# Patient Record
Sex: Female | Born: 1985 | Race: Black or African American | Hispanic: No | Marital: Single | Smoking: Current some day smoker
Health system: Southern US, Community
[De-identification: ages and names within clinical notes are randomized; demographics above are authoritative.]

## PROBLEM LIST (undated history)

## (undated) DIAGNOSIS — E7800 Pure hypercholesterolemia, unspecified: Secondary | ICD-10-CM

## (undated) DIAGNOSIS — L0591 Pilonidal cyst without abscess: Secondary | ICD-10-CM

## (undated) DIAGNOSIS — F419 Anxiety disorder, unspecified: Secondary | ICD-10-CM

## (undated) DIAGNOSIS — I499 Cardiac arrhythmia, unspecified: Secondary | ICD-10-CM

## (undated) DIAGNOSIS — N879 Dysplasia of cervix uteri, unspecified: Secondary | ICD-10-CM

## (undated) DIAGNOSIS — L732 Hidradenitis suppurativa: Secondary | ICD-10-CM

## (undated) DIAGNOSIS — E119 Type 2 diabetes mellitus without complications: Secondary | ICD-10-CM

## (undated) DIAGNOSIS — K760 Fatty (change of) liver, not elsewhere classified: Secondary | ICD-10-CM

## (undated) DIAGNOSIS — E785 Hyperlipidemia, unspecified: Secondary | ICD-10-CM

## (undated) HISTORY — DX: Pure hypercholesterolemia, unspecified: E78.00

## (undated) HISTORY — PX: WISDOM TOOTH EXTRACTION: SHX21

## (undated) HISTORY — DX: Hidradenitis suppurativa: L73.2

## (undated) HISTORY — DX: Type 2 diabetes mellitus without complications: E11.9

## (undated) HISTORY — DX: Fatty (change of) liver, not elsewhere classified: K76.0

## (undated) HISTORY — DX: Pilonidal cyst without abscess: L05.91

## (undated) HISTORY — DX: Dysplasia of cervix uteri, unspecified: N87.9

## (undated) HISTORY — PX: OTHER SURGICAL HISTORY: SHX169

## (undated) HISTORY — PX: PERINEAL HIDRADENITIS EXCISION: SUR524

## (undated) HISTORY — DX: Hyperlipidemia, unspecified: E78.5

---

## 2005-07-16 ENCOUNTER — Emergency Department (HOSPITAL_COMMUNITY): Admission: EM | Admit: 2005-07-16 | Discharge: 2005-07-16 | Payer: Self-pay | Admitting: Family Medicine

## 2006-08-22 ENCOUNTER — Emergency Department (HOSPITAL_COMMUNITY): Admission: EM | Admit: 2006-08-22 | Discharge: 2006-08-23 | Payer: Self-pay | Admitting: Emergency Medicine

## 2007-01-21 ENCOUNTER — Emergency Department (HOSPITAL_COMMUNITY): Admission: EM | Admit: 2007-01-21 | Discharge: 2007-01-21 | Payer: Self-pay | Admitting: Emergency Medicine

## 2007-02-18 ENCOUNTER — Emergency Department (HOSPITAL_COMMUNITY): Admission: EM | Admit: 2007-02-18 | Discharge: 2007-02-18 | Payer: Self-pay | Admitting: Emergency Medicine

## 2007-04-29 ENCOUNTER — Emergency Department: Payer: Self-pay | Admitting: Emergency Medicine

## 2007-05-31 ENCOUNTER — Emergency Department (HOSPITAL_COMMUNITY): Admission: EM | Admit: 2007-05-31 | Discharge: 2007-05-31 | Payer: Self-pay | Admitting: Emergency Medicine

## 2007-06-02 ENCOUNTER — Emergency Department (HOSPITAL_COMMUNITY): Admission: EM | Admit: 2007-06-02 | Discharge: 2007-06-02 | Payer: Self-pay | Admitting: Emergency Medicine

## 2008-02-02 ENCOUNTER — Encounter: Payer: Self-pay | Admitting: Maternal and Fetal Medicine

## 2008-03-01 ENCOUNTER — Encounter: Payer: Self-pay | Admitting: Maternal and Fetal Medicine

## 2008-05-06 ENCOUNTER — Encounter: Payer: Self-pay | Admitting: Maternal & Fetal Medicine

## 2008-05-07 ENCOUNTER — Observation Stay: Payer: Self-pay | Admitting: Obstetrics and Gynecology

## 2008-05-10 ENCOUNTER — Observation Stay: Payer: Self-pay | Admitting: Obstetrics and Gynecology

## 2008-05-11 ENCOUNTER — Observation Stay: Payer: Self-pay | Admitting: Obstetrics and Gynecology

## 2008-05-13 ENCOUNTER — Encounter: Payer: Self-pay | Admitting: Maternal and Fetal Medicine

## 2008-05-17 ENCOUNTER — Encounter: Payer: Self-pay | Admitting: Obstetrics & Gynecology

## 2008-05-18 ENCOUNTER — Observation Stay: Payer: Self-pay | Admitting: Obstetrics and Gynecology

## 2008-05-20 ENCOUNTER — Encounter: Payer: Self-pay | Admitting: Obstetrics and Gynecology

## 2008-05-20 ENCOUNTER — Inpatient Hospital Stay: Payer: Self-pay | Admitting: Obstetrics and Gynecology

## 2008-05-27 ENCOUNTER — Encounter: Payer: Self-pay | Admitting: Maternal & Fetal Medicine

## 2009-05-16 ENCOUNTER — Emergency Department: Payer: Self-pay | Admitting: Emergency Medicine

## 2009-05-18 ENCOUNTER — Emergency Department: Payer: Self-pay | Admitting: Emergency Medicine

## 2010-01-15 DIAGNOSIS — L0591 Pilonidal cyst without abscess: Secondary | ICD-10-CM

## 2010-01-15 HISTORY — DX: Pilonidal cyst without abscess: L05.91

## 2010-01-17 ENCOUNTER — Emergency Department: Payer: Self-pay | Admitting: Internal Medicine

## 2010-03-09 ENCOUNTER — Ambulatory Visit: Payer: Self-pay | Admitting: Surgery

## 2010-03-13 ENCOUNTER — Ambulatory Visit: Payer: Self-pay | Admitting: Surgery

## 2010-08-29 ENCOUNTER — Emergency Department: Payer: Self-pay | Admitting: Unknown Physician Specialty

## 2010-10-06 LAB — POCT URINALYSIS DIP (DEVICE)
Nitrite: NEGATIVE
Protein, ur: 30 — AB
pH: 6.5

## 2010-10-06 LAB — POCT PREGNANCY, URINE
Operator id: 235561
Preg Test, Ur: NEGATIVE

## 2012-01-16 DIAGNOSIS — E119 Type 2 diabetes mellitus without complications: Secondary | ICD-10-CM

## 2012-01-16 DIAGNOSIS — N879 Dysplasia of cervix uteri, unspecified: Secondary | ICD-10-CM

## 2012-01-16 DIAGNOSIS — L732 Hidradenitis suppurativa: Secondary | ICD-10-CM

## 2012-01-16 HISTORY — DX: Hidradenitis suppurativa: L73.2

## 2012-01-16 HISTORY — PX: COLPOSCOPY: SHX161

## 2012-01-16 HISTORY — DX: Dysplasia of cervix uteri, unspecified: N87.9

## 2012-01-16 HISTORY — DX: Type 2 diabetes mellitus without complications: E11.9

## 2012-06-14 ENCOUNTER — Ambulatory Visit: Payer: Self-pay | Admitting: Surgery

## 2012-06-16 LAB — WOUND CULTURE

## 2012-06-30 DIAGNOSIS — E7800 Pure hypercholesterolemia, unspecified: Secondary | ICD-10-CM

## 2012-06-30 DIAGNOSIS — E785 Hyperlipidemia, unspecified: Secondary | ICD-10-CM | POA: Insufficient documentation

## 2012-06-30 HISTORY — DX: Hyperlipidemia, unspecified: E78.5

## 2012-06-30 HISTORY — DX: Pure hypercholesterolemia, unspecified: E78.00

## 2012-07-25 ENCOUNTER — Ambulatory Visit: Payer: Self-pay | Admitting: Nurse Practitioner

## 2012-08-15 ENCOUNTER — Ambulatory Visit: Payer: Self-pay | Admitting: Nurse Practitioner

## 2012-09-15 ENCOUNTER — Ambulatory Visit: Payer: Self-pay | Admitting: Nurse Practitioner

## 2012-10-08 ENCOUNTER — Encounter: Payer: Self-pay | Admitting: General Surgery

## 2012-10-08 ENCOUNTER — Ambulatory Visit (INDEPENDENT_AMBULATORY_CARE_PROVIDER_SITE_OTHER): Payer: 59 | Admitting: General Surgery

## 2012-10-08 VITALS — BP 120/80 | HR 74 | Resp 12 | Ht 71.0 in | Wt 212.0 lb

## 2012-10-08 DIAGNOSIS — L732 Hidradenitis suppurativa: Secondary | ICD-10-CM | POA: Insufficient documentation

## 2012-10-08 NOTE — Progress Notes (Signed)
Patient ID: Carla Payne, female   DOB: 01-27-1985, 27 y.o.   MRN: [id number]  Chief Complaint  Patient presents with  . Other    right axillary abscess    HPI Carla Payne is a 27 y.o. female here today for an evaluation of an right axillary abscess. Patient states she notices this about 6 month . She states it has been draining and wont close up. Patient had at left axillary mass 10 cm removed in May 2014. This was completed by Dr. Michela Pitcher. It was near this time that she was diagnosed with diabetes. She initially was checking her sugars daily, but has become somewhat lax about this. She is accompanied today by her companion, Will.  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 HPI  Past Medical History  Diagnosis Date  . Hyperlipidemia   . Pilonidal cyst 2012  . Diabetes mellitus without complication 2014    Past Surgical History  Procedure Laterality Date  . Axillary mass Left 2014 ,may    No family history on file.  Social History History  Substance Use Topics  . Smoking status: Current Every Day Smoker -- 0.25 packs/day for 10 years  . Smokeless tobacco: Never Used  . Alcohol Use: Yes    No Known Allergies  Current Outpatient Prescriptions  Medication Sig Dispense Refill  . acyclovir (ZOVIRAX) 400 MG tablet Take 1 tablet by mouth daily.      Marland Kitchen glipiZIDE (GLUCOTROL XL) 5 MG 24 hr tablet Take 1 tablet by mouth daily.      . metFORMIN (GLUCOPHAGE) 1000 MG tablet Take 1 tablet by mouth 2 (two) times daily.      Marland Kitchen sulfamethoxazole-trimethoprim (BACTRIM DS) 800-160 MG per tablet Take 1 tablet by mouth daily.      . valACYclovir (VALTREX) 1000 MG tablet Take 1 tablet by mouth daily.       No current facility-administered medications for this visit.    Review of Systems Review of Systems  Constitutional: Negative.   Respiratory: Negative.   Cardiovascular: Negative.     Blood pressure 120/80, pulse 74, resp. rate 12, height 5\' 11"   (1.803 m), weight 212 lb (96.163 kg).  Physical Exam Physical Exam  Constitutional: She is oriented to person, place, and time. She appears well-developed and well-nourished.  Cardiovascular: Normal rate, regular rhythm and normal heart sounds.   Pulmonary/Chest: Breath sounds normal.  Lymphadenopathy:    She has no cervical adenopathy.  Right axillary site has 2 spots that are draining and diffuse thickening covering a 6 x 12 cm area. Left axillary exam shows a less than 5 mm area of granulation tissue, likely at the site of a previously placed drain. No evidence of a better nidus in the axilla..    Neurological: She is alert and oriented to person, place, and time.  Skin: Skin is warm and dry.    Data Reviewed Operative report dated Jun 14, 2012 described drainage of left axillary abscess. Culture showed heavy growth of staph aureus sensitive to all antibiotics.  Assessment    Right axillary hydradenitis     Plan    Excision of this area would be appropriate considering her failure to improve with multiple courses of oral antibiotics.  The importance of daily blood sugar checks as previously instructed by her primary care physician was reviewed. The catastrophic effects of smoking with obesity and diabetes was discussed.     Patient to call the office when ready  to arrange surgery date.  [name] 10/08/2012, 9:16 PM

## 2012-10-08 NOTE — Patient Instructions (Addendum)
Hidradenitis Suppurativa, Sweat Gland Abscess Hidradenitis suppurativa is a long lasting (chronic), uncommon disease of the sweat glands. With this, boil-like lumps and scarring develop in the groin, some times under the arms (axillae), and under the breasts. It may also uncommonly occur behind the ears, in the crease of the buttocks, and around the genitals.  CAUSES  The cause is from a blocking of the sweat glands. They then become infected. It may cause drainage and odor. It is not contagious. So it cannot be given to someone else. It most often shows up in puberty (about 74 to 28 years of age). But it may happen much later. It is similar to acne which is a disease of the sweat glands. This condition is slightly more common in African-Americans and women. SYMPTOMS   Hidradenitis usually starts as one or more red, tender, swellings in the groin or under the arms (axilla).  Over a period of hours to days the lesions get larger. They often open to the skin surface, draining clear to yellow-colored fluid.  The infected area heals with scarring. DIAGNOSIS  Your caregiver makes this diagnosis by looking at you. Sometimes cultures (growing germs on plates in the lab) may be taken. This is to see what germ (bacterium) is causing the infection.  TREATMENT   Topical germ killing medicine applied to the skin (antibiotics) are the treatment of choice. Antibiotics taken by mouth (systemic) are sometimes needed when the condition is getting worse or is severe.  Avoid tight-fitting clothing which traps moisture in.  Dirt does not cause hidradenitis and it is not caused by poor hygiene.  Involved areas should be cleaned daily using an antibacterial soap. Some patients find that the liquid form of Lever 2000, applied to the involved areas as a lotion after bathing, can help reduce the odor related to this condition.  Sometimes surgery is needed to drain infected areas or remove scarred tissue. Removal of  large amounts of tissue is used only in severe cases.  Birth control pills may be helpful.  Oral retinoids (vitamin A derivatives) for 6 to 12 months which are effective for acne may also help this condition.  Weight loss will improve but not cure hidradenitis. It is made worse by being overweight. But the condition is not caused by being overweight.  This condition is more common in people who have had acne.  It may become worse under stress. There is no medical cure for hidradenitis. It can be controlled, but not cured. The condition usually continues for years with periods of getting worse and getting better (remission). Document Released: 08/16/2003 Document Revised: 03/26/2011 Document Reviewed: 09/01/2007 [facility] Patient Information 2014 [place], [place].  Patient to call when ready to arrange surgery date.

## 2012-11-13 ENCOUNTER — Telehealth: Payer: Self-pay | Admitting: *Deleted

## 2012-11-13 NOTE — Telephone Encounter (Signed)
Patient called the office wanting to arrange surgery date. This patient's surgery has been scheduled for 01-05-13 at [facility]. A pre-op visit will be required prior to procedure and this has been arranged accordingly. She will contact the office if she has further questions or concerns.

## 2012-12-28 ENCOUNTER — Other Ambulatory Visit: Payer: Self-pay | Admitting: General Surgery

## 2012-12-28 DIAGNOSIS — L732 Hidradenitis suppurativa: Secondary | ICD-10-CM

## 2012-12-29 ENCOUNTER — Encounter: Payer: Self-pay | Admitting: General Surgery

## 2012-12-29 ENCOUNTER — Ambulatory Visit (INDEPENDENT_AMBULATORY_CARE_PROVIDER_SITE_OTHER): Payer: 59 | Admitting: General Surgery

## 2012-12-29 VITALS — BP 132/78 | HR 70 | Resp 14 | Ht 70.0 in | Wt 221.0 lb

## 2012-12-29 DIAGNOSIS — L732 Hidradenitis suppurativa: Secondary | ICD-10-CM

## 2012-12-29 NOTE — Progress Notes (Signed)
Patient ID: PENIEL BIEL, female   DOB: [birth year], 28 y.o.   MRN: [id number]  Chief Complaint  Patient presents with  . Pre-op Exam    right axillary hidradenitis excision scheduled for 01/05/13    HPI Carla Payne is a 27 y.o. female who presents for her pre op evaluation. She is scheduled 12//22/14 for a right axillary hidradenitis excision.   HPI  Past Medical History  Diagnosis Date  . Hyperlipidemia   . Pilonidal cyst 2012  . Diabetes mellitus without complication 2014    Past Surgical History  Procedure Laterality Date  . Axillary mass Left 2014 ,may    No family history on file.  Social History History  Substance Use Topics  . Smoking status: Current Every Day Smoker -- 0.25 packs/day for 10 years  . Smokeless tobacco: Never Used  . Alcohol Use: Yes    No Known Allergies  Current Outpatient Prescriptions  Medication Sig Dispense Refill  . gemfibrozil (LOPID) 600 MG tablet Take 1 tablet by mouth 2 (two) times daily.      Marland Kitchen glipiZIDE (GLUCOTROL XL) 5 MG 24 hr tablet Take 1 tablet by mouth daily.      . medroxyPROGESTERone (DEPO-PROVERA) 150 MG/ML injection 1 injection IM every 3 months.      . metFORMIN (GLUCOPHAGE) 1000 MG tablet Take 1 tablet by mouth 2 (two) times daily.       No current facility-administered medications for this visit.    Review of Systems Review of Systems  Constitutional: Negative.   Respiratory: Negative.   Cardiovascular: Negative.     Blood pressure 132/78, pulse 70, resp. rate 14, height 5\' 10"  (1.778 m), weight 221 lb (100.245 kg).  Physical Exam Physical Exam  Constitutional: She is oriented to person, place, and time. She appears well-developed and well-nourished.  Neck: No thyromegaly present.  Cardiovascular: Normal rate, regular rhythm and normal heart sounds.   No murmur heard. Pulmonary/Chest: Effort normal and breath sounds normal.  Musculoskeletal:  90 degrees abduction at the right shoulder is noted  secondary to discomfort.  Lymphadenopathy:    She has no cervical adenopathy.  Neurological: She is alert and oriented to person, place, and time.  Skin: Skin is warm and dry.  6 x 6 area of notable draining sinuses centered in the midportion of the axillary skin.      Assessment    Chronic right axillary hidradenitis.  Non-insulin-dependent diabetes mellitus    Plan    Plans for excision were reviewed. I anticipate this will be closed primarily. We'll need to work on shoulder range of motion postoperatively. The possibility of skin grafting has been reviewed.       [name] 12/29/2012, 9:26 AM

## 2012-12-29 NOTE — Patient Instructions (Signed)
Patient scheduled for surgery on 01/05/13.

## 2012-12-30 ENCOUNTER — Encounter: Payer: Self-pay | Admitting: *Deleted

## 2013-01-05 ENCOUNTER — Ambulatory Visit: Payer: Self-pay | Admitting: General Surgery

## 2013-01-05 DIAGNOSIS — L732 Hidradenitis suppurativa: Secondary | ICD-10-CM

## 2013-01-05 HISTORY — PX: AXILLARY HIDRADENITIS EXCISION: SUR522

## 2013-01-07 ENCOUNTER — Ambulatory Visit (INDEPENDENT_AMBULATORY_CARE_PROVIDER_SITE_OTHER): Payer: 59 | Admitting: General Surgery

## 2013-01-07 ENCOUNTER — Encounter: Payer: Self-pay | Admitting: General Surgery

## 2013-01-07 VITALS — BP 144/82 | HR 84 | Resp 12 | Ht 71.0 in | Wt 223.0 lb

## 2013-01-07 DIAGNOSIS — L732 Hidradenitis suppurativa: Secondary | ICD-10-CM

## 2013-01-07 NOTE — Patient Instructions (Signed)
Continue to record drainage May shower

## 2013-01-07 NOTE — Progress Notes (Signed)
Patient ID: Carla Payne, female   DOB: 1985-04-24, 27 y.o.   MRN: [id number]  Chief Complaint  Patient presents with  . Routine Post Op    HPI Carla Payne is a 27 y.o. female.  Here today for her postoperative visit, excision right axillary hidradenitis on 01-05-13. Drain sheet present.  HPI  Past Medical History  Diagnosis Date  . Hyperlipidemia   . Pilonidal cyst 2012  . Diabetes mellitus without complication 2014    Past Surgical History  Procedure Laterality Date  . Axillary mass Left 2014 ,may    No family history on file.  Social History History  Substance Use Topics  . Smoking status: Current Every Day Smoker -- 0.25 packs/day for 10 years  . Smokeless tobacco: Never Used  . Alcohol Use: Yes    No Known Allergies  Current Outpatient Prescriptions  Medication Sig Dispense Refill  . amoxicillin-clavulanate (AUGMENTIN) 875-125 MG per tablet Take 1 tablet by mouth 2 (two) times daily.       Marland Kitchen gemfibrozil (LOPID) 600 MG tablet Take 1 tablet by mouth 2 (two) times daily.      Marland Kitchen glipiZIDE (GLUCOTROL XL) 5 MG 24 hr tablet Take 1 tablet by mouth daily.      . medroxyPROGESTERone (DEPO-PROVERA) 150 MG/ML injection 1 injection IM every 3 months.      . metFORMIN (GLUCOPHAGE) 1000 MG tablet Take 1 tablet by mouth 2 (two) times daily.      Marland Kitchen oxyCODONE-acetaminophen (PERCOCET/ROXICET) 5-325 MG per tablet 1 tablet every 6 (six) hours as needed.        No current facility-administered medications for this visit.    Review of Systems Review of Systems  Constitutional: Negative.   Respiratory: Negative.   Cardiovascular: Negative.     Blood pressure 144/82, pulse 84, resp. rate 12, height 5\' 11"  (1.803 m), weight 223 lb (101.152 kg).  Physical Exam Physical Exam  Constitutional: She is oriented to person, place, and time. She appears well-developed and well-nourished.  Musculoskeletal:  Range of motion upper extremity 90 degrees, in fact slightly improved from  preop.  Neurological: She is alert and oriented to person, place, and time.  Skin: Skin is warm and dry.    Data Reviewed Drainage is been running over 30 cc per day, serosanguineous fluid.  Assessment    Doing well status post excision of extensive area of hidradenitis from the right axilla.    Plan    We will maintain the drain over the next week. Gentle stretching exercises were reviewed. The use of heat, carefully applied, to improve her range of motion was discussed. She may continue to shower.       [name] 01/08/2013, 9:21 AM

## 2013-01-12 ENCOUNTER — Encounter: Payer: Self-pay | Admitting: General Surgery

## 2013-01-14 ENCOUNTER — Encounter: Payer: Self-pay | Admitting: General Surgery

## 2013-01-14 ENCOUNTER — Ambulatory Visit (INDEPENDENT_AMBULATORY_CARE_PROVIDER_SITE_OTHER): Payer: 59 | Admitting: General Surgery

## 2013-01-14 VITALS — BP 120/74 | HR 82 | Resp 12 | Ht 71.0 in | Wt 219.0 lb

## 2013-01-14 DIAGNOSIS — L732 Hidradenitis suppurativa: Secondary | ICD-10-CM

## 2013-01-14 NOTE — Progress Notes (Addendum)
Patient ID: Carla Payne, female   DOB: 02-18-1985, 27 y.o.   MRN: [id number]  Chief Complaint  Patient presents with  . Routine Post Op    HPI Carla Payne is a 27 y.o. female.  Here today for postop visit.  The patient did bring her drain record with her. Significant decrease in volume. Now less than 10 cc per day for the last 3 days. Still having some pain and needs refill on pain medications.  HPI  Past Medical History  Diagnosis Date  . Hyperlipidemia   . Pilonidal cyst 2012  . Diabetes mellitus without complication 2014  . Hidradenitis 2014    Past Surgical History  Procedure Laterality Date  . Axillary mass Left 2014 ,may  . Axillary hidradenitis excision  01-05-13    No family history on file.  Social History History  Substance Use Topics  . Smoking status: Current Every Day Smoker -- 0.25 packs/day for 10 years  . Smokeless tobacco: Never Used  . Alcohol Use: Yes    No Known Allergies  Current Outpatient Prescriptions  Medication Sig Dispense Refill  . amoxicillin-clavulanate (AUGMENTIN) 875-125 MG per tablet Take 1 tablet by mouth 2 (two) times daily.       Marland Kitchen gemfibrozil (LOPID) 600 MG tablet Take 1 tablet by mouth 2 (two) times daily.      Marland Kitchen glipiZIDE (GLUCOTROL XL) 5 MG 24 hr tablet Take 1 tablet by mouth daily.      . medroxyPROGESTERone (DEPO-PROVERA) 150 MG/ML injection 1 injection IM every 3 months.      . metFORMIN (GLUCOPHAGE) 1000 MG tablet Take 1 tablet by mouth 2 (two) times daily.      Marland Kitchen oxyCODONE-acetaminophen (PERCOCET/ROXICET) 5-325 MG per tablet 1 tablet every 6 (six) hours as needed.        No current facility-administered medications for this visit.    Review of Systems Review of Systems  Constitutional: Negative.   Respiratory: Negative.   Cardiovascular: Negative.     Blood pressure 120/74, pulse 82, resp. rate 12, height 5\' 11"  (1.803 m), weight 219 lb (99.338 kg).  Physical Exam Physical Exam  Constitutional: She is  oriented to person, place, and time. She appears well-developed and well-nourished.  Neurological: She is alert and oriented to person, place, and time.  Skin: Skin is warm and dry.  Staples removed steri strips applied. Drain removed.   The patient has had some improvement in her right shoulder range of motion. Prior surgery she was less than 90 of abduction. She is now up to about 110-120 of abduction with encouragement.   Assessment    Good progress.       Plan    Shoulder range of motion needs improvement. Wall walking exercise demonstrated.  I anticipate she will be able to return to work in the next couple of weeks.  A prescription for Norco 5/325, #30 with the inscription one p.o. Q.4 h. P.r.n. For pain was provided. No refills.       [name] 01/16/2013, 12:53 PM

## 2013-01-14 NOTE — Patient Instructions (Signed)
Keep area clean 

## 2013-01-27 ENCOUNTER — Ambulatory Visit (INDEPENDENT_AMBULATORY_CARE_PROVIDER_SITE_OTHER): Payer: 59 | Admitting: General Surgery

## 2013-01-27 ENCOUNTER — Encounter: Payer: Self-pay | Admitting: General Surgery

## 2013-01-27 VITALS — BP 122/68 | HR 82 | Resp 13 | Ht 71.0 in | Wt 220.0 lb

## 2013-01-27 DIAGNOSIS — L732 Hidradenitis suppurativa: Secondary | ICD-10-CM

## 2013-01-27 NOTE — Progress Notes (Signed)
Patient ID: Carla Payne, female   DOB: [birth year], 28 y.o.   MRN: [id number]  Chief Complaint  Patient presents with  . Follow-up    hidradenitis    HPI Carla Payne is a 28 y.o. female.here for follow up for hidridenitis. She reports she is healing well. She does report some light brown to red drainage. She reports this is not a lot. She reports her discomfort is well controlled with the pain medication. HPI  Past Medical History  Diagnosis Date  . Hyperlipidemia   . Pilonidal cyst 2012  . Diabetes mellitus without complication 0347  . Hidradenitis 2014    Past Surgical History  Procedure Laterality Date  . Axillary mass Left 2014 ,may  . Axillary hidradenitis excision  01-05-13    No family history on file.  Social History History  Substance Use Topics  . Smoking status: Current Every Day Smoker -- 0.25 packs/day for 10 years  . Smokeless tobacco: Never Used  . Alcohol Use: Yes    No Known Allergies  Current Outpatient Prescriptions  Medication Sig Dispense Refill  . CHANTIX STARTING MONTH PAK 0.5 MG X 11 & 1 MG X 42 tablet       . gemfibrozil (LOPID) 600 MG tablet Take 1 tablet by mouth 2 (two) times daily.      Marland Kitchen glipiZIDE (GLUCOTROL XL) 5 MG 24 hr tablet Take 1 tablet by mouth daily.      Marland Kitchen HYDROcodone-acetaminophen (NORCO/VICODIN) 5-325 MG per tablet 1 tablet every 4 (four) hours as needed.       . medroxyPROGESTERone (DEPO-PROVERA) 150 MG/ML injection 1 injection IM every 3 months.      . metFORMIN (GLUCOPHAGE) 1000 MG tablet Take 1 tablet by mouth 2 (two) times daily.      . valACYclovir (VALTREX) 1000 MG tablet Take 500 mg by mouth as needed.        No current facility-administered medications for this visit.    Review of Systems Review of Systems  Constitutional: Negative.   Respiratory: Negative.   Cardiovascular: Negative.     Blood pressure 122/68, pulse 82, resp. rate 13, height 5\' 11"  (1.803 m), weight 220 lb (99.791 kg).  Physical  Exam Physical Exam  Constitutional: She is oriented to person, place, and time. She appears well-developed and well-nourished.  Neurological: She is alert and oriented to person, place, and time.  Skin:  Right axilla 8 x 16 mm area of granulation tissue. The visible Vicryl sutures were removed. There he was treated with silver nitrate which was well tolerated. The right shoulder range of motion is markedly improved from her last exam. She now easily reaches 120-130 of abduction.    Data Reviewed None.  Assessment    Focal wound separation midportion of wide excision site.     Plan    The patient will return for nursing silver nitrate application one week, physician exam in 2 weeks.        Robert Bellow 01/27/2013, 9:20 PM

## 2013-01-27 NOTE — Progress Notes (Deleted)
Patient ID: AVILA ALBRITTON, female   DOB: Mar 19, 1985, 28 y.o.   MRN: [id number]  No chief complaint on file.   HPI JANASHA BARKALOW is a 28 y.o. female.  *** HPI  Past Medical History  Diagnosis Date  . Hyperlipidemia   . Pilonidal cyst 2012  . Diabetes mellitus without complication 5638  . Hidradenitis 2014    Past Surgical History  Procedure Laterality Date  . Axillary mass Left 2014 ,may  . Axillary hidradenitis excision  01-05-13    No family history on file.  Social History History  Substance Use Topics  . Smoking status: Current Every Day Smoker -- 0.25 packs/day for 10 years  . Smokeless tobacco: Never Used  . Alcohol Use: Yes    No Known Allergies  Current Outpatient Prescriptions  Medication Sig Dispense Refill  . amoxicillin-clavulanate (AUGMENTIN) 875-125 MG per tablet Take 1 tablet by mouth 2 (two) times daily.       Marland Kitchen gemfibrozil (LOPID) 600 MG tablet Take 1 tablet by mouth 2 (two) times daily.      Marland Kitchen glipiZIDE (GLUCOTROL XL) 5 MG 24 hr tablet Take 1 tablet by mouth daily.      . medroxyPROGESTERone (DEPO-PROVERA) 150 MG/ML injection 1 injection IM every 3 months.      . metFORMIN (GLUCOPHAGE) 1000 MG tablet Take 1 tablet by mouth 2 (two) times daily.      Marland Kitchen oxyCODONE-acetaminophen (PERCOCET/ROXICET) 5-325 MG per tablet 1 tablet every 6 (six) hours as needed.        No current facility-administered medications for this visit.    Review of Systems Review of Systems  There were no vitals taken for this visit.  Physical Exam Physical Exam  Data Reviewed ***  Assessment    ***    Plan    ***       Carson Myrtle 01/27/2013, 10:33 AM

## 2013-02-03 ENCOUNTER — Ambulatory Visit (INDEPENDENT_AMBULATORY_CARE_PROVIDER_SITE_OTHER): Payer: 59 | Admitting: *Deleted

## 2013-02-03 DIAGNOSIS — L732 Hidradenitis suppurativa: Secondary | ICD-10-CM

## 2013-02-03 NOTE — Patient Instructions (Signed)
Follow up as scheduled.  

## 2013-02-03 NOTE — Progress Notes (Signed)
Here today for wound check.  Silver nitrate used to open area, healing well.  No signs of infection noted.  Advised that she can use heating pad to shoulder as needed for comfort as well as Aleve.

## 2013-02-09 ENCOUNTER — Ambulatory Visit (INDEPENDENT_AMBULATORY_CARE_PROVIDER_SITE_OTHER): Payer: 59 | Admitting: General Surgery

## 2013-02-09 ENCOUNTER — Encounter: Payer: Self-pay | Admitting: General Surgery

## 2013-02-09 VITALS — BP 122/76 | HR 80 | Resp 12 | Ht 71.0 in | Wt 220.0 lb

## 2013-02-09 DIAGNOSIS — L732 Hidradenitis suppurativa: Secondary | ICD-10-CM

## 2013-02-09 NOTE — Patient Instructions (Signed)
Patient to return in 2 weeks for follow up.

## 2013-02-09 NOTE — Progress Notes (Signed)
Patient ID: Carla Payne, female   DOB: April 24, 1985, 28 y.o.   MRN: [id number]  Chief Complaint  Patient presents with  . Follow-up    2 week follow up hidradenitis    HPI Carla Payne is a 28 y.o. female who presents for a 2 week follow up of hidradenitis. The patient underwent an excision of axillary hidradenitis on 01/05/13. The patient denies any new problems at this time. Overall doing well.   HPI  Past Medical History  Diagnosis Date  . Hyperlipidemia   . Pilonidal cyst 2012  . Diabetes mellitus without complication 2542  . Hidradenitis 2014    Past Surgical History  Procedure Laterality Date  . Axillary mass Left 2014 ,may  . Axillary hidradenitis excision  01-05-13    History reviewed. No pertinent family history.  Social History History  Substance Use Topics  . Smoking status: Current Every Day Smoker -- 0.25 packs/day for 10 years  . Smokeless tobacco: Never Used  . Alcohol Use: Yes    No Known Allergies  Current Outpatient Prescriptions  Medication Sig Dispense Refill  . CHANTIX STARTING MONTH PAK 0.5 MG X 11 & 1 MG X 42 tablet       . gemfibrozil (LOPID) 600 MG tablet Take 1 tablet by mouth 2 (two) times daily.      Marland Kitchen glipiZIDE (GLUCOTROL XL) 5 MG 24 hr tablet Take 1 tablet by mouth daily.      Marland Kitchen HYDROcodone-acetaminophen (NORCO/VICODIN) 5-325 MG per tablet 1 tablet every 4 (four) hours as needed.       . medroxyPROGESTERone (DEPO-PROVERA) 150 MG/ML injection 1 injection IM every 3 months.      . metFORMIN (GLUCOPHAGE) 1000 MG tablet Take 1 tablet by mouth 2 (two) times daily.      . valACYclovir (VALTREX) 1000 MG tablet Take 500 mg by mouth as needed.        No current facility-administered medications for this visit.    Review of Systems Review of Systems  Constitutional: Negative.   Respiratory: Negative.   Cardiovascular: Negative.     Blood pressure 122/76, pulse 80, resp. rate 12, height 5\' 11"  (1.803 m), weight 220 lb (99.791  kg).  Physical Exam Physical Exam  Constitutional: She is oriented to person, place, and time. She appears well-developed and well-nourished.  Musculoskeletal:       Right shoulder: Normal.  Right shoulder range of motion is tremendously improved from preop. No normal.  The wide excision site showed several small Vicryl sutures coming through the skin which were removed without incident. There is a less than 5 mm area of granulation tissue in the midportion of the wound and was treated with silver nitrate. This was well tolerated.  Neurological: She is alert and oriented to person, place, and time.  Skin: Skin is warm and dry.     Assessment    Doing well status post excision right axillary hidradenitis.   Plan    Followup exam in 2 weeks. Anticipate return to work at that time.        Robert Bellow 02/10/2013, 6:25 AM

## 2013-02-23 ENCOUNTER — Ambulatory Visit (INDEPENDENT_AMBULATORY_CARE_PROVIDER_SITE_OTHER): Payer: 59 | Admitting: General Surgery

## 2013-02-23 ENCOUNTER — Encounter: Payer: Self-pay | Admitting: General Surgery

## 2013-02-23 VITALS — BP 124/80 | HR 76 | Resp 12 | Ht 71.0 in | Wt 226.0 lb

## 2013-02-23 DIAGNOSIS — L732 Hidradenitis suppurativa: Secondary | ICD-10-CM

## 2013-02-23 NOTE — Patient Instructions (Signed)
Patient to return in two weeks . May return to work 02/24/13.

## 2013-02-23 NOTE — Progress Notes (Signed)
Patient ID: Carla Payne, female   DOB: 12/12/1985, 28 y.o.   MRN: [id number]  Chief Complaint  Patient presents with  . Follow-up    hidrodenitis    HPI Carla Payne is a 28 y.o. female who presents for a 1 month follow up of hidradenitis. The patient underwent an excision of axillary hidradenitis on 01/05/13. The patient denies any new problems at this time. Overall doing well. The patient reports no drainage. No difficulty with shoulder range of motion.  HPI  Past Medical History  Diagnosis Date  . Hyperlipidemia   . Pilonidal cyst 2012  . Diabetes mellitus without complication 9629  . Hidradenitis 2014    Past Surgical History  Procedure Laterality Date  . Axillary mass Left 2014 ,may  . Axillary hidradenitis excision  01-05-13    No family history on file.  Social History History  Substance Use Topics  . Smoking status: Current Every Day Smoker -- 0.25 packs/day for 10 years  . Smokeless tobacco: Never Used  . Alcohol Use: Yes    No Known Allergies  Current Outpatient Prescriptions  Medication Sig Dispense Refill  . CHANTIX STARTING MONTH PAK 0.5 MG X 11 & 1 MG X 42 tablet       . gemfibrozil (LOPID) 600 MG tablet Take 1 tablet by mouth 2 (two) times daily.      Marland Kitchen glipiZIDE (GLUCOTROL XL) 5 MG 24 hr tablet Take 1 tablet by mouth daily.      Marland Kitchen HYDROcodone-acetaminophen (NORCO/VICODIN) 5-325 MG per tablet 1 tablet every 4 (four) hours as needed.       . medroxyPROGESTERone (DEPO-PROVERA) 150 MG/ML injection 1 injection IM every 3 months.      . metFORMIN (GLUCOPHAGE) 1000 MG tablet Take 1 tablet by mouth 2 (two) times daily.      . valACYclovir (VALTREX) 1000 MG tablet Take 500 mg by mouth as needed.        No current facility-administered medications for this visit.    Review of Systems Review of Systems  Constitutional: Negative.   Respiratory: Negative.   Cardiovascular: Negative.     Blood pressure 124/80, pulse 76, resp. rate 12, height 5\' 11"   (1.803 m), weight 226 lb (102.513 kg).  Physical Exam Physical Exam  Constitutional: She is oriented to person, place, and time. She appears well-developed and well-nourished.  Pulmonary/Chest:   8 mm granulation tissue , 1 cm of thickening right axilla  Neurological: She is alert and oriented to person, place, and time.  Skin: Skin is warm and dry.      Assessment    Small full alert of residual granulation tissue. Responding to silver nitrate treatment.     Plan    We'll plan for reassessment to 2- 4 weeks. The patient will return to work in the near future.        Robert Bellow 02/23/2013, 8:50 PM

## 2013-03-10 ENCOUNTER — Ambulatory Visit: Payer: 59 | Admitting: General Surgery

## 2013-03-16 ENCOUNTER — Encounter: Payer: Self-pay | Admitting: General Surgery

## 2013-03-16 ENCOUNTER — Ambulatory Visit (INDEPENDENT_AMBULATORY_CARE_PROVIDER_SITE_OTHER): Payer: 59 | Admitting: General Surgery

## 2013-03-16 VITALS — BP 138/74 | HR 74 | Resp 14 | Ht 71.0 in | Wt 230.0 lb

## 2013-03-16 DIAGNOSIS — L732 Hidradenitis suppurativa: Secondary | ICD-10-CM

## 2013-03-16 NOTE — Patient Instructions (Signed)
Patient to return as needed. Patient is advised she can start back exercising. Patient to call with any questions or concerns.

## 2013-03-16 NOTE — Progress Notes (Signed)
Patient ID: Carla Payne, female   DOB: 1985-10-13, 28 y.o.   MRN: [id number]  Chief Complaint  Patient presents with  . Follow-up    hidradenitis    HPI Carla Payne is a 28 y.o. female follow up of hidradenitis. The patient underwent an excision of axillary hidradenitis on 01/05/13. The patient denies any new problems at this time. Overall doing well.   HPI  Past Medical History  Diagnosis Date  . Hyperlipidemia   . Pilonidal cyst 2012  . Diabetes mellitus without complication 3267  . Hidradenitis 2014    Past Surgical History  Procedure Laterality Date  . Axillary mass Left 2014 ,may  . Axillary hidradenitis excision  01-05-13    History reviewed. No pertinent family history.  Social History History  Substance Use Topics  . Smoking status: Current Every Day Smoker -- 0.25 packs/day for 10 years  . Smokeless tobacco: Never Used  . Alcohol Use: Yes    No Known Allergies  Current Outpatient Prescriptions  Medication Sig Dispense Refill  . CHANTIX STARTING MONTH PAK 0.5 MG X 11 & 1 MG X 42 tablet       . gemfibrozil (LOPID) 600 MG tablet Take 1 tablet by mouth 2 (two) times daily.      Marland Kitchen glipiZIDE (GLUCOTROL XL) 5 MG 24 hr tablet Take 1 tablet by mouth daily.      Marland Kitchen HYDROcodone-acetaminophen (NORCO/VICODIN) 5-325 MG per tablet 1 tablet every 4 (four) hours as needed.       . medroxyPROGESTERone (DEPO-PROVERA) 150 MG/ML injection 1 injection IM every 3 months.      . metFORMIN (GLUCOPHAGE) 1000 MG tablet Take 1 tablet by mouth 2 (two) times daily.      . valACYclovir (VALTREX) 1000 MG tablet Take 500 mg by mouth as needed.        No current facility-administered medications for this visit.    Review of Systems Review of Systems  Constitutional: Negative.   Respiratory: Negative.   Cardiovascular: Negative.     Blood pressure 138/74, pulse 74, resp. rate 14, height 5\' 11"  (1.803 m), weight 230 lb (104.327 kg).  Physical Exam Physical Exam   Constitutional: She is oriented to person, place, and time. She appears well-developed and well-nourished.  Neurological: She is alert and oriented to person, place, and time.  Skin: Skin is warm and dry.   2 pinpoint areas of granulation tissue were identified along the midportion of the incision. These were treated with silver nitrate.  Shoulder range of motion is full, 160 of abduction.     Assessment    Doing well status post surgical excision of right axillary hidradenitis.    Plan    The patient will notify the office if she has any further problems. Follow up otherwise will be on an as-needed basis.       Robert Bellow 03/17/2013, 4:01 PM

## 2013-04-16 ENCOUNTER — Ambulatory Visit: Payer: 59 | Admitting: General Surgery

## 2013-04-22 ENCOUNTER — Encounter: Payer: Self-pay | Admitting: General Surgery

## 2013-04-22 ENCOUNTER — Ambulatory Visit (INDEPENDENT_AMBULATORY_CARE_PROVIDER_SITE_OTHER): Payer: 59 | Admitting: General Surgery

## 2013-04-22 VITALS — BP 120/84 | HR 78 | Temp 97.9°F | Resp 14 | Ht 71.0 in | Wt 228.0 lb

## 2013-04-22 DIAGNOSIS — L732 Hidradenitis suppurativa: Secondary | ICD-10-CM

## 2013-04-22 MED ORDER — DOXYCYCLINE HYCLATE 100 MG PO CAPS: 100.0000 mg | ORAL_CAPSULE | Freq: Two times a day (BID) | ORAL | Status: DC

## 2013-04-22 NOTE — Patient Instructions (Addendum)
Patient to return in 2 weeks for follow up. Patient to start antibiotics. The patient advised to use heat in the area. The patient is aware to call back for any questions or concerns.

## 2013-04-22 NOTE — Progress Notes (Signed)
Patient ID: Carla Payne, female   DOB: 1985-03-14, 28 y.o.   MRN: [id number]  Chief Complaint  Patient presents with  . Follow-up    hidradenitis    HPI Carla Payne is a 28 y.o. female who presents for an evaluation of left hidradenitis. The patient states she noticed it approximately 2 weeks ago. She was placed on antibiotics which she has completed. She complains of pain but not drainage. She was seen at Urgent Care which they tried lancing the area but only blood was present. She states the area is hard and sensitive to the touch. No current fever at this time.  She is not experiencing any discomfort in the right axilla where she underwent operative excision of an extensive area of hidradenitis   *HPI  Past Medical History  Diagnosis Date  . Hyperlipidemia   . Pilonidal cyst 2012  . Diabetes mellitus without complication 9562  . Hidradenitis 2014    Past Surgical History  Procedure Laterality Date  . Axillary mass Left 2014 ,may  . Axillary hidradenitis excision  01-05-13    History reviewed. No pertinent family history.  Social History History  Substance Use Topics  . Smoking status: Current Every Day Smoker -- 0.25 packs/day for 10 years  . Smokeless tobacco: Never Used  . Alcohol Use: Yes    No Known Allergies  Current Outpatient Prescriptions  Medication Sig Dispense Refill  . CHANTIX STARTING MONTH PAK 0.5 MG X 11 & 1 MG X 42 tablet       . gemfibrozil (LOPID) 600 MG tablet Take 1 tablet by mouth 2 (two) times daily.      Marland Kitchen glipiZIDE (GLUCOTROL XL) 5 MG 24 hr tablet Take 1 tablet by mouth daily.      Marland Kitchen HYDROcodone-acetaminophen (NORCO/VICODIN) 5-325 MG per tablet 1 tablet every 4 (four) hours as needed.       . medroxyPROGESTERone (DEPO-PROVERA) 150 MG/ML injection 1 injection IM every 3 months.      . metFORMIN (GLUCOPHAGE) 1000 MG tablet Take 1 tablet by mouth 2 (two) times daily.      . traMADol (ULTRAM) 50 MG tablet       . valACYclovir (VALTREX)  1000 MG tablet Take 500 mg by mouth as needed.       . doxycycline (VIBRAMYCIN) 100 MG capsule Take 1 capsule (100 mg total) by mouth 2 (two) times daily.  60 capsule  2   No current facility-administered medications for this visit.    Review of Systems Review of Systems  Constitutional: Negative.   Respiratory: Negative.   Cardiovascular: Negative.     Blood pressure 120/84, pulse 78, temperature 97.9 F (36.6 C), temperature source Oral, resp. rate 14, height 5\' 11"  (1.803 m), weight 228 lb (103.42 kg).  Physical Exam Physical Exam  Constitutional: She is oriented to person, place, and time. She appears well-developed and well-nourished.  Cardiovascular: Normal rate, regular rhythm and normal heart sounds.   No murmur heard. Pulmonary/Chest: Effort normal and breath sounds normal.  Left axilla thickening superior lateral aspect 2 cm area.  No fluctuance.  Neurological: She is alert and oriented to person, place, and time.  Skin: Skin is warm and dry.  Full right shoulder range of motion.    Assessment    Early left axillary inflammatory process.     Plan    Will initiate antibiotics with doxycycline 100 mg b.i.d. (The patient reports that she is not pregnant nor will she become pregnant). Local  he was encouraged. Will plan on reassessment in 2 weeks, earlier if needed.  She can continue her present exercise program.     PCP: Perrin Maltese MD  Forest Gleason Laster Appling 04/22/2013, 9:27 PM

## 2013-05-06 ENCOUNTER — Ambulatory Visit (INDEPENDENT_AMBULATORY_CARE_PROVIDER_SITE_OTHER): Payer: 59 | Admitting: General Surgery

## 2013-05-06 ENCOUNTER — Encounter: Payer: Self-pay | Admitting: General Surgery

## 2013-05-06 VITALS — BP 122/74 | HR 84 | Resp 14 | Ht 70.0 in | Wt 230.0 lb

## 2013-05-06 DIAGNOSIS — L732 Hidradenitis suppurativa: Secondary | ICD-10-CM

## 2013-05-06 NOTE — Progress Notes (Signed)
Patient ID: Carla Payne, female   DOB: 04-Dec-1985, 28 y.o.   MRN: [id number]  Chief Complaint  Patient presents with  . Follow-up    hidradenitis    HPI Carla Payne is a 28 y.o. female.here today following up from hidradenitis.after her last visit, she did notice to increase drainage for a few days. Patient states the area is still draining but seems to closing up. Currently on Doxycycline 2 times daily.              HPI  Past Medical History  Diagnosis Date  . Hyperlipidemia   . Pilonidal cyst 2012  . Diabetes mellitus without complication 8921  . Hidradenitis 2014    Past Surgical History  Procedure Laterality Date  . Axillary mass Left 2014 ,may  . Axillary hidradenitis excision  01-05-13    No family history on file.  Social History History  Substance Use Topics  . Smoking status: Current Every Day Smoker -- 0.25 packs/day for 10 years  . Smokeless tobacco: Never Used  . Alcohol Use: Yes    No Known Allergies  Current Outpatient Prescriptions  Medication Sig Dispense Refill  . CHANTIX STARTING MONTH PAK 0.5 MG X 11 & 1 MG X 42 tablet       . doxycycline (VIBRAMYCIN) 100 MG capsule Take 1 capsule (100 mg total) by mouth 2 (two) times daily.  60 capsule  2  . gemfibrozil (LOPID) 600 MG tablet Take 1 tablet by mouth 2 (two) times daily.      Marland Kitchen glipiZIDE (GLUCOTROL XL) 5 MG 24 hr tablet Take 1 tablet by mouth daily.      Marland Kitchen HYDROcodone-acetaminophen (NORCO/VICODIN) 5-325 MG per tablet 1 tablet every 4 (four) hours as needed.       . medroxyPROGESTERone (DEPO-PROVERA) 150 MG/ML injection 1 injection IM every 3 months.      . metFORMIN (GLUCOPHAGE) 1000 MG tablet Take 1 tablet by mouth 2 (two) times daily.      . traMADol (ULTRAM) 50 MG tablet       . valACYclovir (VALTREX) 1000 MG tablet Take 500 mg by mouth as needed.        No current facility-administered medications for this visit.    Review of Systems Review of Systems  Constitutional: Negative.    Respiratory: Negative.   Cardiovascular: Negative.     Blood pressure 122/74, pulse 84, resp. rate 14, height 5\' 10"  (1.778 m), weight 230 lb (104.327 kg).  Physical Exam Physical Exam  Constitutional: She is oriented to person, place, and time. She appears well-developed and well-nourished.  Eyes: Conjunctivae are normal.  Neck: Neck supple.  Cardiovascular: Normal rate, regular rhythm and normal heart sounds.   Pulmonary/Chest: Effort normal and breath sounds normal.  Lymphadenopathy:  Left axilla 5 cm granulate tissue and 2 cm of thickening.   Neurological: She is alert and oriented to person, place, and time.  Skin: Skin is warm and dry.       Assessment    Left axillary hidradenitis.     Plan    At this time the patient reports the symptoms did not warrant surgical excision. Should notify the office of her progress. Followup otherwise be on an as-needed basis.     PCP: Wynona Meals Kimberleigh Mehan 05/07/2013, 8:08 PM

## 2013-05-06 NOTE — Patient Instructions (Addendum)
Patient to return as needed.  Call office for any issues or concerns.

## 2013-11-16 ENCOUNTER — Encounter: Payer: Self-pay | Admitting: General Surgery

## 2014-05-07 NOTE — Op Note (Signed)
PATIENT NAME:  Carla Payne, Carla Payne MR#:  [id number] DATE OF BIRTH:  01/01/1986  DATE OF PROCEDURE:  01/05/2013  PREOPERATIVE DIAGNOSIS: Right axillary hidradenitis suppurativa.   POSTOPERATIVE DIAGNOSIS: Right axillary hidradenitis suppurativa.   OPERATIVE PROCEDURE: Excision of right axillary hidradenitis with primary closure.   OPERATING SURGEON:  Hervey Ard.   ANESTHESIA: General by LMA under Dr. Kayleen Memos, Marcaine 0.5% plain 30 mL local infiltration.   ESTIMATED BLOOD LOSS: Less than 25 mL.   CLINICAL NOTE: This 29 year old woman has a history of multiple recurrent abscesses in the right axilla. She was failed to respond to conservative treatment. She was felt to be a candidate for wide excision.   The patient received Kefzol intravenously prior to the procedure.   OPERATIVE NOTE: With the patient under adequate general anesthesia, a bolster placed behind the right shoulder and the arm prepped from the area above the elbow to the axilla and anterior chest. The wound was draped. Marcaine was infiltrated for postoperative analgesia. The area of inflammatory process was approximately 6 x 8 cm in diameter. This results in a 15 cm incision running transversely across the axilla. The skin was incised sharply, and the remaining dissection completed with electrocautery. Most of the process was superficial, but there was 1 area approximately 1.5 to 2 cm in diameter that extended deep into the adipose tissue. This area was initially curetted and then actually the fibrous tissue was excised to allow better approximation of the deep tissue. The superior skin showed some residual inflammatory process and an additional 1 cm section was taken so that soft skin was available for approximation. The area was mobilized by freeing the adipose tissue and making 7 to 8 mm thick skin flaps. The deep tissue was approximated with interrupted 2-0 Vicryl figure-of-eight sutures after irrigation with saline. A 19-gauge  Blake drain was brought out through a separate stab wound incision and anchored in place with 3-0 nylon to minimize a late seroma formation. The wound was closed in multiple layers. The deep dermal layer was approximated with interrupted 2-0 and 3-0 Vicryl sutures. The skin was then closed with staples. A Telfa and Tegaderm dressing were applied to the drain site, and a combination Telfa pad/adhesive pad applied to the wound.   The patient tolerated the procedure well and was taken to the recovery room in stable condition.     ____________________________ Robert Bellow, MD jwb:dmm D: 01/05/2013 16:07:00 ET T: 01/05/2013 22:39:25 ET JOB#: [id number]  cc: Chelsa B. Matthew Saras, NP Robert Bellow, MD, <Dictator> Eleina Jergens Amedeo Kinsman MD ELECTRONICALLY SIGNED 01/06/2013 19:05

## 2014-05-07 NOTE — Op Note (Signed)
PATIENT NAME:  Carla Payne, Carla Payne MR#:  [id number] DATE OF BIRTH:  December 04, 1985  DATE OF PROCEDURE:  06/14/2012  PREOPERATIVE DIAGNOSIS:  Left axillary abscess.  POSTOPERATIVE DIAGNOSIS:  Left axillary abscess.   PROCEDURE PERFORMED:  Incision and drainage left axillary abscess.   ANESTHESIA:  General.   SURGEON:  Rodena Goldmann, M.D.   OPERATIVE PROCEDURE:  With the patient in the supine position after the induction of appropriate general anesthesia, the patient's left axilla was prepped and draped in sterile towels.  The area had already began to drain spontaneously.  The area was cultured and then the incision opened.  A small pocket was encountered with a significant amount of induration surrounding it.  A counter incision was made and a Penrose drain placed through the pocket and on the counter incision.  It was secured in place with 3-0 nylon.  The area was cleaned.  Sterile dressings applied.  The patient was returned to the recovery room after tolerating the procedure well.  Sponge, instrument and needle counts were correct x 2 in the operating room.     ____________________________ Rodena Goldmann III, MD rle:ea D: 06/14/2012 11:28:09 ET T: 06/14/2012 18:28:51 ET JOB#: [id number]  cc: Rodena Goldmann III, MD, <Dictator> Rodena Goldmann MD ELECTRONICALLY SIGNED 06/15/2012 8:05

## 2014-09-02 ENCOUNTER — Encounter: Payer: Self-pay | Admitting: General Surgery

## 2014-09-02 ENCOUNTER — Ambulatory Visit (INDEPENDENT_AMBULATORY_CARE_PROVIDER_SITE_OTHER): Payer: 59 | Admitting: General Surgery

## 2014-09-02 VITALS — BP 130/78 | HR 82 | Resp 15 | Ht 70.0 in | Wt 222.0 lb

## 2014-09-02 DIAGNOSIS — L732 Hidradenitis suppurativa: Secondary | ICD-10-CM

## 2014-09-02 MED ORDER — DOXYCYCLINE MONOHYDRATE 100 MG PO CAPS: 100.0000 mg | ORAL_CAPSULE | Freq: Two times a day (BID) | ORAL | Status: DC

## 2014-09-02 NOTE — Progress Notes (Signed)
Patient ID: Carla Payne, female   DOB: 28-May-1985, 29 y.o.   MRN: [id number]  Chief Complaint  Patient presents with  . Follow-up    hidradenitis bikini line    HPI Carla Payne is a 29 y.o. female here for evaluation of a new area of hidradenitis in her bikini line. She reports this most recent eruption started on the left side on 08/16/14. On 08/20/14 she was seen at Urgent Care for this. They lanced the area and placed her on Bactrim. She states that the area has not fully healed. She now states that she has a new eruption on the right side that started yesterday.  HPI  Past Medical History  Diagnosis Date  . Hyperlipidemia   . Pilonidal cyst 2012  . Diabetes mellitus without complication 4008  . Hidradenitis 2014    Past Surgical History  Procedure Laterality Date  . Axillary mass Left 2014 ,may  . Axillary hidradenitis excision  01-05-13    No family history on file.  Social History Social History  Substance Use Topics  . Smoking status: Current Every Day Smoker -- 0.25 packs/day for 10 years  . Smokeless tobacco: Never Used  . Alcohol Use: Yes    No Known Allergies  Current Outpatient Prescriptions  Medication Sig Dispense Refill  . glipiZIDE (GLUCOTROL XL) 5 MG 24 hr tablet Take 1 tablet by mouth daily.    . medroxyPROGESTERone (DEPO-PROVERA) 150 MG/ML injection 1 injection IM every 3 months.    . metFORMIN (GLUCOPHAGE) 1000 MG tablet Take 1 tablet by mouth 2 (two) times daily.    . valACYclovir (VALTREX) 1000 MG tablet Take 500 mg by mouth as needed.     . doxycycline (MONODOX) 100 MG capsule Take 1 capsule (100 mg total) by mouth 2 (two) times daily. 30 capsule 0   No current facility-administered medications for this visit.    Review of Systems Review of Systems  Constitutional: Negative.   Respiratory: Negative.   Cardiovascular: Negative.     Blood pressure 130/78, pulse 82, resp. rate 15, height 5\' 10"  (1.778 m), weight 222 lb (100.699  kg).  Physical Exam Physical Exam  Constitutional: She is oriented to person, place, and time. She appears well-developed and well-nourished.  Eyes: Conjunctivae are normal. No scleral icterus.  Neck: Neck supple.  Cardiovascular: Normal rate, regular rhythm and normal heart sounds.   Pulmonary/Chest: Effort normal and breath sounds normal.    Abdominal:    Lymphadenopathy:    She has no cervical adenopathy.  Neurological: She is alert and oriented to person, place, and time.  Skin: Skin is warm and dry.  2x3 cm area in the left groin fold with thickening and tenderness 2-2x3 cm areas of thickening in the right groin fold   Psychiatric: She has a normal mood and affect.    Data Reviewed None  Assessment    Bilateral inguinal hidradenitis.    Plan    The patient will be placed on suppressive therapy with doxycycline 100 mg by mouth twice a day. Excision of both areas is most reasonable considering the degree of disease is symmetrical.  The patient is reporting fairly poor glucose control at present, and this can impact healing.  Patient to check schedule and call back to arrange surgery date at her convenience.     PCP: Thayer Ohm 09/04/2014, 8:39 AM

## 2014-09-02 NOTE — Patient Instructions (Addendum)
Hidradenitis Suppurativa, Sweat Gland Abscess Hidradenitis suppurativa is a long lasting (chronic), uncommon disease of the sweat glands. With this, boil-like lumps and scarring develop in the groin, some times under the arms (axillae), and under the breasts. It may also uncommonly occur behind the ears, in the crease of the buttocks, and around the genitals.  CAUSES  The cause is from a blocking of the sweat glands. They then become infected. It may cause drainage and odor. It is not contagious. So it cannot be given to someone else. It most often shows up in puberty (about 9 to 30 years of age). But it may happen much later. It is similar to acne which is a disease of the sweat glands. This condition is slightly more common in African-Americans and women. SYMPTOMS   Hidradenitis usually starts as one or more red, tender, swellings in the groin or under the arms (axilla).  Over a period of hours to days the lesions get larger. They often open to the skin surface, draining clear to yellow-colored fluid.  The infected area heals with scarring. DIAGNOSIS  Your caregiver makes this diagnosis by looking at you. Sometimes cultures (growing germs on plates in the lab) may be taken. This is to see what germ (bacterium) is causing the infection.  TREATMENT   Topical germ killing medicine applied to the skin (antibiotics) are the treatment of choice. Antibiotics taken by mouth (systemic) are sometimes needed when the condition is getting worse or is severe.  Avoid tight-fitting clothing which traps moisture in.  Dirt does not cause hidradenitis and it is not caused by poor hygiene.  Involved areas should be cleaned daily using an antibacterial soap. Some patients find that the liquid form of Lever 2000, applied to the involved areas as a lotion after bathing, can help reduce the odor related to this condition.  Sometimes surgery is needed to drain infected areas or remove scarred tissue. Removal of  large amounts of tissue is used only in severe cases.  Birth control pills may be helpful.  Oral retinoids (vitamin A derivatives) for 6 to 12 months which are effective for acne may also help this condition.  Weight loss will improve but not cure hidradenitis. It is made worse by being overweight. But the condition is not caused by being overweight.  This condition is more common in people who have had acne.  It may become worse under stress. There is no medical cure for hidradenitis. It can be controlled, but not cured. The condition usually continues for years with periods of getting worse and getting better (remission). Document Released: 08/16/2003 Document Revised: 03/26/2011 Document Reviewed: 04/03/2013 [facility] Patient Information 2015 [place], [place]. This information is not intended to replace advice given to you by your health care provider. Make sure you discuss any questions you have with your health care provider.  Patient to check schedule and call back to arrange surgery date at her convenience.

## 2014-09-08 ENCOUNTER — Telehealth: Payer: Self-pay | Admitting: *Deleted

## 2014-09-08 NOTE — Telephone Encounter (Signed)
Message left for patient to call the office.

## 2014-09-08 NOTE — Telephone Encounter (Signed)
Patient called the office back this afternoon.   Surgery has been scheduled for 10-12-14 at [facility]. Patient will come in for a pre-op visit one week prior to surgery. She has also been asked to start her antibiotics (doxycycline) 10 days prior to surgery.   Patient verbalizes understanding.   Rosann Auerbach to give the patient a call regarding FMLA paperwork per patient's request.

## 2014-09-09 ENCOUNTER — Encounter: Payer: Self-pay | Admitting: General Surgery

## 2014-10-04 ENCOUNTER — Inpatient Hospital Stay
Admission: RE | Admit: 2014-10-04 | Discharge: 2014-10-04 | Disposition: A | Discharge status: Home / Self Care | Payer: Self-pay | Source: Ambulatory Visit

## 2014-10-05 ENCOUNTER — Ambulatory Visit (INDEPENDENT_AMBULATORY_CARE_PROVIDER_SITE_OTHER): Payer: 59 | Admitting: General Surgery

## 2014-10-05 ENCOUNTER — Encounter: Payer: Self-pay | Admitting: General Surgery

## 2014-10-05 VITALS — BP 122/82 | HR 88 | Resp 12 | Ht 69.0 in | Wt 223.0 lb

## 2014-10-05 DIAGNOSIS — L732 Hidradenitis suppurativa: Secondary | ICD-10-CM

## 2014-10-05 NOTE — Progress Notes (Addendum)
Patient ID: Carla Payne, female   DOB: 25-Jan-1985, 29 y.o.   MRN: [id number]  Chief Complaint  Patient presents with  . Pre-op Exam    grion excision     HPI Carla Payne is a 29 y.o. female.  Here today for pre op visit, excision bilateral groin hidradenitis scheduled for 10-12-14.  HPI  Past Medical History  Diagnosis Date  . Hyperlipidemia   . Pilonidal cyst 2012  . Diabetes mellitus without complication 1517  . Hidradenitis 2014    Past Surgical History  Procedure Laterality Date  . Axillary mass Left 2014 ,may  . Axillary hidradenitis excision  01-05-13    No family history on file.  Social History Social History  Substance Use Topics  . Smoking status: Current Every Day Smoker -- 0.25 packs/day for 10 years  . Smokeless tobacco: Never Used  . Alcohol Use: Yes    No Known Allergies  Current Outpatient Prescriptions  Medication Sig Dispense Refill  . doxycycline (MONODOX) 100 MG capsule Take 1 capsule (100 mg total) by mouth 2 (two) times daily. 30 capsule 0  . glipiZIDE (GLUCOTROL XL) 5 MG 24 hr tablet Take 1 tablet by mouth daily.    . medroxyPROGESTERone (DEPO-PROVERA) 150 MG/ML injection 1 injection IM every 3 months.    . metFORMIN (GLUCOPHAGE) 1000 MG tablet Take 1 tablet by mouth 2 (two) times daily.    . valACYclovir (VALTREX) 1000 MG tablet Take 500 mg by mouth as needed.     . simvastatin (ZOCOR) 10 MG tablet Take 10 mg by mouth daily at 6 PM.      No current facility-administered medications for this visit.    Review of Systems Review of Systems  Constitutional: Negative.   Respiratory: Negative.   Cardiovascular: Negative.     Blood pressure 122/82, pulse 88, resp. rate 12, height 5\' 9"  (1.753 m), weight 223 lb (101.152 kg), last menstrual period 09/27/2014.  Physical Exam Physical Exam  Constitutional: She is oriented to person, place, and time. She appears well-developed and well-nourished.  HENT:  Mouth/Throat: Oropharynx is clear  and moist.  Eyes: No scleral icterus.  Neck: Neck supple. No thyromegaly present.  Cardiovascular: Normal rate and regular rhythm.   Pulmonary/Chest: Effort normal and breath sounds normal.  Abdominal: Soft.    bilateral groin is unchanged.  Lymphadenopathy:    She has no cervical adenopathy.  Neurological: She is alert and oriented to person, place, and time.  Skin: Skin is warm and dry.  Psychiatric: Her behavior is normal.      Assessment    Hidradenitis of the groins     Plan    Plans for surgical excision reviewed. Return to work discussed. She has initiated doxycycline therapy has requested     Discussed surgery in detail.  PCP:  Thayer Ohm 10/06/2014, 6:10 AM

## 2014-10-05 NOTE — Patient Instructions (Addendum)
The patient is aware to call back for any questions or concerns. Hidradenitis Suppurativa, Sweat Gland Abscess Hidradenitis suppurativa is a long lasting (chronic), uncommon disease of the sweat glands. With this, boil-like lumps and scarring develop in the groin, some times under the arms (axillae), and under the breasts. It may also uncommonly occur behind the ears, in the crease of the buttocks, and around the genitals.  CAUSES  The cause is from a blocking of the sweat glands. They then become infected. It may cause drainage and odor. It is not contagious. So it cannot be given to someone else. It most often shows up in puberty (about 21 to 30 years of age). But it may happen much later. It is similar to acne which is a disease of the sweat glands. This condition is slightly more common in African-Americans and women. SYMPTOMS   Hidradenitis usually starts as one or more red, tender, swellings in the groin or under the arms (axilla).  Over a period of hours to days the lesions get larger. They often open to the skin surface, draining clear to yellow-colored fluid.  The infected area heals with scarring. DIAGNOSIS  Your caregiver makes this diagnosis by looking at you. Sometimes cultures (growing germs on plates in the lab) may be taken. This is to see what germ (bacterium) is causing the infection.  TREATMENT   Topical germ killing medicine applied to the skin (antibiotics) are the treatment of choice. Antibiotics taken by mouth (systemic) are sometimes needed when the condition is getting worse or is severe.  Avoid tight-fitting clothing which traps moisture in.  Dirt does not cause hidradenitis and it is not caused by poor hygiene.  Involved areas should be cleaned daily using an antibacterial soap. Some patients find that the liquid form of Lever 2000, applied to the involved areas as a lotion after bathing, can help reduce the odor related to this condition.  Sometimes surgery is  needed to drain infected areas or remove scarred tissue. Removal of large amounts of tissue is used only in severe cases.  Birth control pills may be helpful.  Oral retinoids (vitamin A derivatives) for 6 to 12 months which are effective for acne may also help this condition.  Weight loss will improve but not cure hidradenitis. It is made worse by being overweight. But the condition is not caused by being overweight.  This condition is more common in people who have had acne.  It may become worse under stress. There is no medical cure for hidradenitis. It can be controlled, but not cured. The condition usually continues for years with periods of getting worse and getting better (remission). Document Released: 08/16/2003 Document Revised: 03/26/2011 Document Reviewed: 04/03/2013 [facility] Patient Information 2015 [place], [place]. This information is not intended to replace advice given to you by your health care provider. Make sure you discuss any questions you have with your health care provider.

## 2014-10-06 ENCOUNTER — Encounter: Payer: Self-pay | Admitting: *Deleted

## 2014-10-06 NOTE — H&P (Signed)
HPI  Carla Payne is a 29 y.o. female. Here today for pre op visit, excision bilateral groin hidradenitis scheduled for 10-12-14.  HPI  Past Medical History   Diagnosis  Date   .  Hyperlipidemia    .  Pilonidal cyst  2012   .  Diabetes mellitus without complication  6384   .  Hidradenitis  2014    Past Surgical History   Procedure  Laterality  Date   .  Axillary mass  Left  2014 ,may   .  Axillary hidradenitis excision   01-05-13    No family history on file.  Social History  Social History   Substance Use Topics   .  Smoking status:  Current Every Day Smoker -- 0.25 packs/day for 10 years   .  Smokeless tobacco:  Never Used   .  Alcohol Use:  Yes    No Known Allergies  Current Outpatient Prescriptions   Medication  Sig  Dispense  Refill   .  doxycycline (MONODOX) 100 MG capsule  Take 1 capsule (100 mg total) by mouth 2 (two) times daily.  30 capsule  0   .  glipiZIDE (GLUCOTROL XL) 5 MG 24 hr tablet  Take 1 tablet by mouth daily.     .  medroxyPROGESTERone (DEPO-PROVERA) 150 MG/ML injection  1 injection IM every 3 months.     .  metFORMIN (GLUCOPHAGE) 1000 MG tablet  Take 1 tablet by mouth 2 (two) times daily.     .  valACYclovir (VALTREX) 1000 MG tablet  Take 500 mg by mouth as needed.     .  simvastatin (ZOCOR) 10 MG tablet  Take 10 mg by mouth daily at 6 PM.      No current facility-administered medications for this visit.    Review of Systems  Review of Systems  Constitutional: Negative.  Respiratory: Negative.  Cardiovascular: Negative.   Blood pressure 122/82, pulse 88, resp. rate 12, height 5\' 9"  (1.753 m), weight 223 lb (101.152 kg), last menstrual period 09/27/2014.  Physical Exam  Physical Exam  Constitutional: She is oriented to person, place, and time. She appears well-developed and well-nourished.  HENT:  Mouth/Throat: Oropharynx is clear and moist.  Eyes: No scleral icterus.  Neck: Neck supple.  Abdominal: Soft.    bilateral groin is unchanged.    Lymphadenopathy:  She has no cervical adenopathy.  Neurological: She is alert and oriented to person, place, and time.  Skin: Skin is warm and dry.  Psychiatric: Her behavior is normal.   Assessment   Hidradenitis of the groins   Plan   Plans for surgical excision reviewed. Return to work discussed. She has initiated doxycycline therapy has requested

## 2014-10-06 NOTE — Patient Instructions (Signed)
  Your procedure is scheduled on: 10-12-14 Report to [place] To find out your arrival time please call [phone] between 1PM - 3PM on 10-11-14 [facility])  Remember: Instructions that are not followed completely may result in serious medical risk, up to and including death, or upon the discretion of your surgeon and anesthesiologist your surgery may need to be rescheduled.    __X__ 1. Do not eat food or drink liquids after midnight. No gum chewing or hard candies.     __X__ 2. No Alcohol for 24 hours before or after surgery.   ____ 3. Bring all medications with you on the Shaneeka Scarboro of surgery if instructed.    __X__ 4. Notify your doctor if there is any change in your medical condition     (cold, fever, infections).     Do not wear jewelry, make-up, hairpins, clips or nail polish.  Do not wear lotions, powders, or perfumes. You may wear deodorant.  Do not shave 48 hours prior to surgery. Men may shave face and neck.  Do not bring valuables to the hospital.    [facility] is not responsible for any belongings or valuables.               Contacts, dentures or bridgework may not be worn into surgery.  Leave your suitcase in the car. After surgery it may be brought to your room.  For patients admitted to the hospital, discharge time is determined by your treatment team.   Patients discharged the Whitni Pasquini of surgery will not be allowed to drive home.   Please read over the following fact sheets that you were given:      ____ Take these medicines the morning of surgery with A SIP OF WATER:    1. NONE  2.   3.   4.  5.  6.  ____ Fleet Enema (as directed)   _X___ Use CHG Soap as directed  ____ Use inhalers on the Mickey Esguerra of surgery  _X___ Stop metformin 2 days prior to surgery-LAST DOSE ON Saturday 10-09-14   ____ Take 1/2 of usual insulin dose the night before surgery and none on the morning of surgery.   ____ Stop Coumadin/Plavix/aspirin-N/A  ____ Stop  Anti-inflammatories-NO NSAIDS OR ASPIRIN PRODUCTS-TYLENOL OK   ____ Stop supplements until after surgery.    ____ Bring C-Pap to the hospital.

## 2014-10-07 ENCOUNTER — Encounter
Admission: RE | Admit: 2014-10-07 | Discharge: 2014-10-07 | Disposition: A | Discharge status: Home / Self Care | Payer: 59 | Source: Ambulatory Visit | Attending: Anesthesiology | Admitting: Anesthesiology

## 2014-10-07 DIAGNOSIS — Z01812 Encounter for preprocedural laboratory examination: Secondary | ICD-10-CM | POA: Insufficient documentation

## 2014-10-07 LAB — BASIC METABOLIC PANEL
ANION GAP: 8 (ref 5–15)
BUN: 7 mg/dL (ref 6–20)
CALCIUM: 9.3 mg/dL (ref 8.9–10.3)
CO2: 24 mmol/L (ref 22–32)
CREATININE: 0.57 mg/dL (ref 0.44–1.00)
Chloride: 106 mmol/L (ref 101–111)
GFR calc non Af Amer: >60 mL/min (ref >60)
Glucose, Bld: 129 mg/dL — ABNORMAL HIGH (ref 65–99)
Potassium: 3.6 mmol/L (ref 3.5–5.1)
SODIUM: 138 mmol/L (ref 135–145)

## 2014-10-07 NOTE — H&P (Signed)
Updated cardiopulmonary exam.  .Physical Exam  Constitutional: She is oriented to person, place, and time. She appears well-developed and well-nourished.  HENT:  Mouth/Throat: Oropharynx is clear and moist.  Eyes: No scleral icterus.  Neck: Neck supple. No thyromegaly present.  Cardiovascular: Normal rate and regular rhythm.  Pulmonary/Chest: Effort normal and breath sounds normal.

## 2014-10-12 ENCOUNTER — Ambulatory Visit: Payer: 59 | Admitting: Anesthesiology

## 2014-10-12 ENCOUNTER — Encounter
Admission: RE | Disposition: A | Discharge status: Home / Self Care | Payer: Self-pay | Source: Ambulatory Visit | Attending: General Surgery

## 2014-10-12 ENCOUNTER — Ambulatory Visit
Admission: RE | Admit: 2014-10-12 | Discharge: 2014-10-12 | Disposition: A | Discharge status: Home / Self Care | Payer: 59 | Source: Ambulatory Visit | Attending: General Surgery | Admitting: General Surgery

## 2014-10-12 ENCOUNTER — Encounter: Payer: Self-pay | Admitting: *Deleted

## 2014-10-12 DIAGNOSIS — E119 Type 2 diabetes mellitus without complications: Secondary | ICD-10-CM | POA: Diagnosis not present

## 2014-10-12 DIAGNOSIS — F17210 Nicotine dependence, cigarettes, uncomplicated: Secondary | ICD-10-CM | POA: Insufficient documentation

## 2014-10-12 DIAGNOSIS — L732 Hidradenitis suppurativa: Secondary | ICD-10-CM | POA: Diagnosis not present

## 2014-10-12 DIAGNOSIS — E785 Hyperlipidemia, unspecified: Secondary | ICD-10-CM | POA: Insufficient documentation

## 2014-10-12 DIAGNOSIS — F419 Anxiety disorder, unspecified: Secondary | ICD-10-CM | POA: Diagnosis not present

## 2014-10-12 HISTORY — DX: Anxiety disorder, unspecified: F41.9

## 2014-10-12 HISTORY — PX: HYDRADENITIS EXCISION: SHX5243

## 2014-10-12 LAB — GLUCOSE, CAPILLARY
GLUCOSE-CAPILLARY: 200 mg/dL — AB (ref 65–99)
GLUCOSE-CAPILLARY: 165 mg/dL — AB (ref 65–99)

## 2014-10-12 LAB — POCT PREGNANCY, URINE: PREG TEST UR: NEGATIVE

## 2014-10-12 SURGERY — EXCISION, HIDRADENITIS, INGUINAL REGION
Anesthesia: General | Laterality: Bilateral | Wound class: Clean Contaminated

## 2014-10-12 MED ORDER — NAFCILLIN SODIUM 2 G IJ SOLR
2.0000 g | Freq: Once | INTRAMUSCULAR | Status: DC
  Filled 2014-10-12: qty 2000

## 2014-10-12 MED ORDER — LIDOCAINE HCL (CARDIAC) 20 MG/ML IV SOLN
INTRAVENOUS | Status: DC | PRN
  Administered 2014-10-12: 80 mg via INTRAVENOUS

## 2014-10-12 MED ORDER — HYDROCODONE-ACETAMINOPHEN 5-325 MG PO TABS: 1.0000 | ORAL_TABLET | ORAL | Status: DC | PRN

## 2014-10-12 MED ORDER — ACETAMINOPHEN 10 MG/ML IV SOLN
INTRAVENOUS | Status: AC
  Filled 2014-10-12: qty 100

## 2014-10-12 MED ORDER — FENTANYL CITRATE (PF) 100 MCG/2ML IJ SOLN
INTRAMUSCULAR | Status: DC | PRN
  Administered 2014-10-12 (×2): 50 ug via INTRAVENOUS

## 2014-10-12 MED ORDER — BUPIVACAINE HCL (PF) 0.5 % IJ SOLN
INTRAMUSCULAR | Status: AC
  Filled 2014-10-12: qty 30

## 2014-10-12 MED ORDER — PROMETHAZINE HCL 25 MG/ML IJ SOLN: 12.5000 mg | INTRAMUSCULAR | Status: DC | PRN

## 2014-10-12 MED ORDER — OXYCODONE HCL 5 MG/5ML PO SOLN: 5.0000 mg | Freq: Once | ORAL | Status: AC | PRN

## 2014-10-12 MED ORDER — BUPIVACAINE HCL (PF) 0.5 % IJ SOLN
INTRAMUSCULAR | Status: DC | PRN
  Administered 2014-10-12: 22.5 mL

## 2014-10-12 MED ORDER — FAMOTIDINE 20 MG PO TABS
20.0000 mg | ORAL_TABLET | Freq: Once | ORAL | Status: AC
  Administered 2014-10-12: 20 mg via ORAL

## 2014-10-12 MED ORDER — FENTANYL CITRATE (PF) 100 MCG/2ML IJ SOLN
INTRAMUSCULAR | Status: AC
  Filled 2014-10-12: qty 2

## 2014-10-12 MED ORDER — NAFCILLIN SODIUM 2 G IJ SOLR
2.0000 g | Freq: Once | INTRAVENOUS | Status: AC
  Administered 2014-10-12: 2 g via INTRAVENOUS
  Filled 2014-10-12: qty 2000

## 2014-10-12 MED ORDER — LIDOCAINE HCL (PF) 1 % IJ SOLN
INTRAMUSCULAR | Status: AC
  Filled 2014-10-12: qty 30

## 2014-10-12 MED ORDER — LIDOCAINE HCL (PF) 1 % IJ SOLN
INTRAMUSCULAR | Status: DC | PRN
  Administered 2014-10-12: 22.5 mL

## 2014-10-12 MED ORDER — FENTANYL CITRATE (PF) 100 MCG/2ML IJ SOLN
25.0000 ug | INTRAMUSCULAR | Status: AC | PRN
  Administered 2014-10-12 (×6): 25 ug via INTRAVENOUS

## 2014-10-12 MED ORDER — ACETAMINOPHEN 10 MG/ML IV SOLN
INTRAVENOUS | Status: DC | PRN
  Administered 2014-10-12: 1000 mg via INTRAVENOUS

## 2014-10-12 MED ORDER — BUPIVACAINE-EPINEPHRINE (PF) 0.5% -1:200000 IJ SOLN
INTRAMUSCULAR | Status: AC
  Filled 2014-10-12: qty 30

## 2014-10-12 MED ORDER — OXYCODONE HCL 5 MG PO TABS
ORAL_TABLET | ORAL | Status: AC
  Filled 2014-10-12: qty 1

## 2014-10-12 MED ORDER — FAMOTIDINE 20 MG PO TABS
ORAL_TABLET | ORAL | Status: AC
  Administered 2014-10-12: 20 mg via ORAL
  Filled 2014-10-12: qty 1

## 2014-10-12 MED ORDER — PHENYLEPHRINE HCL 10 MG/ML IJ SOLN
INTRAMUSCULAR | Status: DC | PRN
  Administered 2014-10-12 (×2): 100 ug via INTRAVENOUS

## 2014-10-12 MED ORDER — SODIUM CHLORIDE 0.9 % IV SOLN
INTRAVENOUS | Status: DC
  Administered 2014-10-12: 07:00:00 via INTRAVENOUS

## 2014-10-12 MED ORDER — PROPOFOL 10 MG/ML IV BOLUS
INTRAVENOUS | Status: DC | PRN
  Administered 2014-10-12: 150 mg via INTRAVENOUS

## 2014-10-12 MED ORDER — MIDAZOLAM HCL 5 MG/5ML IJ SOLN
INTRAMUSCULAR | Status: DC | PRN
  Administered 2014-10-12: 2 mg via INTRAVENOUS

## 2014-10-12 MED ORDER — OXYCODONE HCL 5 MG PO TABS
5.0000 mg | ORAL_TABLET | Freq: Once | ORAL | Status: AC | PRN
  Administered 2014-10-12: 5 mg via ORAL

## 2014-10-12 SURGICAL SUPPLY — 29 items
BLADE CLIPPER SURG (BLADE) ×2 IMPLANT
BRIEF STRETCH MATERNITY 2XLG (MISCELLANEOUS) ×2 IMPLANT
CANISTER SUCT 1200ML W/VALVE (MISCELLANEOUS) ×3 IMPLANT
CHLORAPREP W/TINT 26ML (MISCELLANEOUS) ×3 IMPLANT
CLOSURE WOUND 1/2 X4 (GAUZE/BANDAGES/DRESSINGS)
DRAPE LAPAROTOMY 100X77 ABD (DRAPES) ×3 IMPLANT
DRESSING TELFA 4X3 1S ST N-ADH (GAUZE/BANDAGES/DRESSINGS) ×1 IMPLANT
DRSG TELFA 3X8 NADH (GAUZE/BANDAGES/DRESSINGS) ×6 IMPLANT
GLOVE BIO SURGEON STRL SZ7.5 (GLOVE) ×7 IMPLANT
GLOVE INDICATOR 8.0 STRL GRN (GLOVE) ×7 IMPLANT
GOWN STRL REUS W/ TWL LRG LVL3 (GOWN DISPOSABLE) ×2 IMPLANT
GOWN STRL REUS W/TWL LRG LVL3 (GOWN DISPOSABLE) ×9
KIT RM TURNOVER STRD PROC AR (KITS) ×3 IMPLANT
LABEL OR SOLS (LABEL) ×3 IMPLANT
NDL HYPO 25X1 1.5 SAFETY (NEEDLE) ×1 IMPLANT
NDL SAFETY 22GX1.5 (NEEDLE) ×3 IMPLANT
NEEDLE HYPO 25X1 1.5 SAFETY (NEEDLE) IMPLANT
PACK BASIN MINOR ARMC (MISCELLANEOUS) ×3 IMPLANT
PAD ABD DERMACEA PRESS 5X9 (GAUZE/BANDAGES/DRESSINGS) ×4 IMPLANT
PAD DRESSING TELFA 3X8 NADH (GAUZE/BANDAGES/DRESSINGS) IMPLANT
PAD GROUND ADULT SPLIT (MISCELLANEOUS) ×3 IMPLANT
STAPLER SKIN PROX 35W (STAPLE) ×2 IMPLANT
STRIP CLOSURE SKIN 1/2X4 (GAUZE/BANDAGES/DRESSINGS) ×1 IMPLANT
SUT MNCRL 3-0 UNDYED SH (SUTURE) IMPLANT
SUT MONOCRYL 3-0 UNDYED (SUTURE) ×4
SUT VIC AB 3-0 SH 27 (SUTURE)
SUT VIC AB 3-0 SH 27X BRD (SUTURE) ×2 IMPLANT
SWABSTK COMLB BENZOIN TINCTURE (MISCELLANEOUS) ×1 IMPLANT
SYR CONTROL 10ML (SYRINGE) ×3 IMPLANT

## 2014-10-12 NOTE — Anesthesia Procedure Notes (Signed)
Procedure Name: LMA Insertion Date/Time: 10/12/2014 7:33 AM Performed by: Delaney Meigs Pre-anesthesia Checklist: Patient identified, Emergency Drugs available, Suction available, Patient being monitored and Timeout performed Patient Re-evaluated:Patient Re-evaluated prior to inductionOxygen Delivery Method: Circle system utilized and Simple face mask Preoxygenation: Pre-oxygenation with 100% oxygen Intubation Type: IV induction Ventilation: Mask ventilation without difficulty LMA Size: 3.5 Number of attempts: 1 Placement Confirmation: breath sounds checked- equal and bilateral and positive ETCO2 Tube secured with: Tape

## 2014-10-12 NOTE — Op Note (Signed)
Preoperative diagnosis: Bilateral inguinal hidradenitis  Postoperative diagnosis: Same.  Operative procedure: Excision of bilateral groin history adenitis.  Operating surgeon: Ollen Bowl, M.D.  Anesthesia: Gen. by LMA, 45 mL 0.5% Xylocaine with 0.25% Marcaine, plain.  Clinical note: This patient has developed symptomatic hidradenitis of the inguinal area. This is failed to respond to conservative treatment. She is previously undergone axillary hidradenitis excision. She is brought to the operative room for operative excision.  Operative note: With the patient under adequate general anesthesia she was placed in the frog-leg position with the heels supported on a pillow and appropriate straps for security. The hair was removed from the inguinal area. Chlor prep was applied. 2 elliptical incisions were utilized on the right side to encompass the area of chronic inflammation. The largest was 7 cm in length and 1.5 cm in width, the second approximately 2.5 cm in length and 1 cm in width. The area were incised sharply after instillation of local anesthesia. Electrocautery was used for hemostasis. The wound was approximated in layers with interrupted 3-0 Monocryl sutures. Staples were applied to the skin.  Attention was turned to the left groin where there was a more irregular inflammatory mass with an extension towards the top of the labia majora. An inverted "T" incision was utilized. This was incised sharply as with the contralateral side. Good hemostasis was noted. Multiple layer closure of 3-0 Monocryl suture was utilized. Staples were applied for skin closure. Dry dressings were applied and the patient taken to recovery in stable condition.

## 2014-10-12 NOTE — Anesthesia Postprocedure Evaluation (Signed)
  Anesthesia Post-op Note  Patient: Carla Payne  Procedure(s) Performed: Procedure(s): EXCISION HIDRADENITIS GROIN (Bilateral)  Anesthesia type:General  Patient location: PACU  Post pain: Pain level controlled  Post assessment: Post-op Vital signs reviewed, Patient's Cardiovascular Status Stable, Respiratory Function Stable, Patent Airway and No signs of Nausea or vomiting  Post vital signs: Reviewed and stable  Last Vitals:  Filed Vitals:   10/12/14 0915  BP: 141/96  Pulse: 83  Temp: 35.9 C  Resp: 16    Level of consciousness: awake, alert  and patient cooperative  Complications: No apparent anesthesia complications

## 2014-10-12 NOTE — Anesthesia Preprocedure Evaluation (Signed)
Anesthesia Evaluation  Patient identified by MRN, date of birth, ID band Patient awake    Reviewed: Allergy & Precautions, H&P , NPO status , Patient's Chart, lab work & pertinent test results  History of Anesthesia Complications Negative for: history of anesthetic complications  Airway Mallampati: II  TM Distance: >3 FB Neck ROM: full    Dental no notable dental hx. (+) Teeth Intact   Pulmonary neg shortness of breath, Current Smoker,    Pulmonary exam normal breath sounds clear to auscultation       Cardiovascular Exercise Tolerance: Good (-) Past MI negative cardio ROS Normal cardiovascular exam Rhythm:regular Rate:Normal     Neuro/Psych PSYCHIATRIC DISORDERS Anxiety negative neurological ROS     GI/Hepatic negative GI ROS, Neg liver ROS, neg GERD  ,  Endo/Other  diabetes, Well Controlled, Type 2, Oral Hypoglycemic Agents  Renal/GU negative Renal ROS  negative genitourinary   Musculoskeletal   Abdominal   Peds  Hematology negative hematology ROS (+)   Anesthesia Other Findings Past Medical History:   Hyperlipidemia                                               Pilonidal cyst                                  2012         Diabetes mellitus without complication          4540         Hidradenitis                                    2014         Anxiety                                                      Reproductive/Obstetrics negative OB ROS                             Anesthesia Physical Anesthesia Plan  ASA: III  Anesthesia Plan: General   Post-op Pain Management:    Induction:   Airway Management Planned:   Additional Equipment:   Intra-op Plan:   Post-operative Plan:   Informed Consent: I have reviewed the patients History and Physical, chart, labs and discussed the procedure including the risks, benefits and alternatives for the proposed anesthesia with the patient  or authorized representative who has indicated his/her understanding and acceptance.   Dental Advisory Given  Plan Discussed with: Anesthesiologist, CRNA and Surgeon  Anesthesia Plan Comments: (LMA vs ETT based on positioning )        Anesthesia Quick Evaluation

## 2014-10-12 NOTE — Discharge Instructions (Signed)

## 2014-10-12 NOTE — H&P (Signed)
No change in clinical history. For bilateral groin hidradenitis excision.

## 2014-10-12 NOTE — Transfer of Care (Signed)
Immediate Anesthesia Transfer of Care Note  Patient: Carla Payne  Procedure(s) Performed: Procedure(s): EXCISION HIDRADENITIS GROIN (Bilateral)  Patient Location: PACU  Anesthesia Type:General  Level of Consciousness: sedated  Airway & Oxygen Therapy: Patient Spontanous Breathing and Patient connected to face mask oxygen  Post-op Assessment: Report given to RN and Post -op Vital signs reviewed and stable  Post vital signs: Reviewed and stable  Last Vitals: 137/80 99hr 100% sat 99.8temp 29resp   Filed Vitals:   10/12/14 0628  BP: 133/86  Pulse: 109  Temp: 36.9 C  Resp: 16    Complications: No apparent anesthesia complications

## 2014-10-13 LAB — SURGICAL PATHOLOGY

## 2014-10-19 ENCOUNTER — Ambulatory Visit (INDEPENDENT_AMBULATORY_CARE_PROVIDER_SITE_OTHER): Payer: 59 | Admitting: General Surgery

## 2014-10-19 ENCOUNTER — Encounter: Payer: Self-pay | Admitting: General Surgery

## 2014-10-19 VITALS — BP 118/82 | HR 94 | Resp 16

## 2014-10-19 DIAGNOSIS — L732 Hidradenitis suppurativa: Secondary | ICD-10-CM

## 2014-10-19 MED ORDER — DOCUSATE SODIUM 100 MG PO CAPS: 100.0000 mg | ORAL_CAPSULE | Freq: Two times a day (BID) | ORAL | Status: DC

## 2014-10-19 NOTE — Patient Instructions (Addendum)
The patient is aware to call back for any questions or concerns. 2 Aleve twice a day Follow up in 3 weeks. May use stool softener, Colace.  Apply heat as needed for comfort.

## 2014-10-19 NOTE — Progress Notes (Signed)
Patient ID: Carla Payne, female   DOB: November 02, 1985, 29 y.o.   MRN: [id number]  Chief Complaint  Patient presents with  . Routine Post Op    HPI Carla Payne is a 29 y.o. female.  Here today for postoperative visit, bilateral groin hidradenitis on 10-12-14. States she is making progress just still in pain.  HPI  Past Medical History  Diagnosis Date  . Hyperlipidemia   . Pilonidal cyst 2012  . Diabetes mellitus without complication ([place]) 7096  . Hidradenitis 2014  . Anxiety     Past Surgical History  Procedure Laterality Date  . Axillary mass Left 2014 ,may  . Axillary hidradenitis excision  01-05-13  . Perineal hidradenitis excision    . Hydradenitis excision Bilateral 10/12/2014    Procedure: EXCISION HIDRADENITIS GROIN;  Surgeon: Robert Bellow, MD;  Location: [facility] ORS;  Service: General;  Laterality: Bilateral;    No family history on file.  Social History Social History  Substance Use Topics  . Smoking status: Current Every Day Smoker -- 0.25 packs/day for 10 years    Types: Cigarettes  . Smokeless tobacco: Never Used  . Alcohol Use: Yes     Comment: wine on weekends    No Known Allergies  Current Outpatient Prescriptions  Medication Sig Dispense Refill  . doxycycline (MONODOX) 100 MG capsule Take 1 capsule (100 mg total) by mouth 2 (two) times daily. 30 capsule 0  . glipiZIDE (GLUCOTROL XL) 5 MG 24 hr tablet Take 1 tablet by mouth daily.    Marland Kitchen HYDROcodone-acetaminophen (NORCO) 5-325 MG per tablet Take 1-2 tablets by mouth every 4 (four) hours as needed for moderate pain. 30 tablet 0  . medroxyPROGESTERone (DEPO-PROVERA) 150 MG/ML injection 1 injection IM every 3 months.    . metFORMIN (GLUCOPHAGE) 1000 MG tablet Take 1 tablet by mouth 2 (two) times daily.    . simvastatin (ZOCOR) 10 MG tablet Take 10 mg by mouth daily at 6 PM.     . valACYclovir (VALTREX) 1000 MG tablet Take 500 mg by mouth as needed.     . docusate sodium (COLACE) 100 MG capsule Take 1  capsule (100 mg total) by mouth 2 (two) times daily. 30 capsule 0   No current facility-administered medications for this visit.    Review of Systems Review of Systems  Constitutional: Negative.   Respiratory: Negative.   Cardiovascular: Negative.     Blood pressure 118/82, pulse 94, resp. rate 16, last menstrual period 09/27/2014.  Physical Exam Physical Exam  Constitutional: She is oriented to person, place, and time. She appears well-developed and well-nourished.  Abdominal:    Genitourinary:  Staples removed, incision healing.  Neurological: She is alert and oriented to person, place, and time.  Skin: Skin is warm and dry.  Psychiatric: Her behavior is normal.    Data Reviewed Pathology showed no malignancy.  Assessment    Doing well status post bilateral inguinal excision of the hidradenitis.    Plan     Follow up in 3 weeks. May use stool softener, Colace. Continue to take 2 Aleve twice a day. Apply heat as needed for comfort.     PCP:  Thayer Ohm 10/19/2014, 11:13 AM

## 2014-11-08 ENCOUNTER — Encounter: Payer: Self-pay | Admitting: General Surgery

## 2014-11-08 ENCOUNTER — Ambulatory Visit (INDEPENDENT_AMBULATORY_CARE_PROVIDER_SITE_OTHER): Payer: 59 | Admitting: General Surgery

## 2014-11-08 VITALS — BP 120/76 | HR 78 | Resp 12 | Ht 70.0 in | Wt 212.0 lb

## 2014-11-08 DIAGNOSIS — L732 Hidradenitis suppurativa: Secondary | ICD-10-CM

## 2014-11-08 MED ORDER — MUPIROCIN CALCIUM 2 % EX CREA: TOPICAL_CREAM | CUTANEOUS | Status: DC

## 2014-11-08 NOTE — Progress Notes (Signed)
Patient ID: Carla Payne, female   DOB: [birth year], 29 y.o.   MRN: [id number]  Chief Complaint  Patient presents with  . Routine Post Op    hidradernitis    HPI Carla Payne is a 29 y.o. female here today for her follow up hidrademitis excision done on 10/12/14. She states on the left side there is a knot and right side is doing okay .   I personally reviewed the above history with the patient. HPI  Past Medical History  Diagnosis Date  . Hyperlipidemia   . Pilonidal cyst 2012  . Diabetes mellitus without complication ([place]) 1941  . Hidradenitis 2014  . Anxiety     Past Surgical History  Procedure Laterality Date  . Axillary mass Left 2014 ,may  . Axillary hidradenitis excision  01-05-13  . Perineal hidradenitis excision    . Hydradenitis excision Bilateral 10/12/2014    Procedure: EXCISION HIDRADENITIS GROIN;  Surgeon: Robert Bellow, MD;  Location: [facility] ORS;  Service: General;  Laterality: Bilateral;    No family history on file.  Social History Social History  Substance Use Topics  . Smoking status: Current Every Day Smoker -- 0.25 packs/day for 10 years    Types: Cigarettes  . Smokeless tobacco: Never Used  . Alcohol Use: Yes     Comment: wine on weekends    No Known Allergies  Current Outpatient Prescriptions  Medication Sig Dispense Refill  . docusate sodium (COLACE) 100 MG capsule Take 1 capsule (100 mg total) by mouth 2 (two) times daily. 30 capsule 0  . doxycycline (MONODOX) 100 MG capsule Take 1 capsule (100 mg total) by mouth 2 (two) times daily. 30 capsule 0  . glipiZIDE (GLUCOTROL XL) 5 MG 24 hr tablet Take 1 tablet by mouth daily.    Marland Kitchen HYDROcodone-acetaminophen (NORCO) 5-325 MG per tablet Take 1-2 tablets by mouth every 4 (four) hours as needed for moderate pain. 30 tablet 0  . medroxyPROGESTERone (DEPO-PROVERA) 150 MG/ML injection 1 injection IM every 3 months.    . metFORMIN (GLUCOPHAGE) 1000 MG tablet Take 1 tablet by mouth 2 (two) times  daily.    . simvastatin (ZOCOR) 10 MG tablet Take 10 mg by mouth daily at 6 PM.     . valACYclovir (VALTREX) 1000 MG tablet Take 500 mg by mouth as needed.     . mupirocin cream (BACTROBAN) 2 % Apply to affected area 3 times daily 30 g 0   No current facility-administered medications for this visit.    Review of Systems Review of Systems  Constitutional: Negative.   Respiratory: Negative.   Cardiovascular: Negative.     Blood pressure 120/76, pulse 78, resp. rate 12, height 5\' 10"  (1.778 m), weight 212 lb (96.163 kg).  Physical Exam Physical Exam  Constitutional: She is oriented to person, place, and time. She appears well-developed and well-nourished.  Abdominal:    Genitourinary:  Right incision is clean and healing well.   Neurological: She is alert and oriented to person, place, and time.  Skin: Skin is warm and dry.      Assessment    Slow healing secondary to inverted "T" incision left groin.    Plan    The patient was reassured and the area is progressing. Follow-up in one month.       PCP:  Thayer Ohm 11/10/2014, 5:24 PM

## 2014-11-08 NOTE — Patient Instructions (Signed)
Patient to return in one month. 

## 2014-11-24 ENCOUNTER — Telehealth: Payer: Self-pay | Admitting: *Deleted

## 2014-11-24 NOTE — Telephone Encounter (Signed)
I talked with the patient and she said she thought she had a yeast infection. She has vaginal burning and a discharge which started last week when she finished her antibiotics and her period stopped. Advised to start eating yogurt and OTC vaginal yeast medication until I could talk with Dr Bary Castilla. She also was asking about returning to work on Monday 11-29-14 since she still had that small open area.?  Her follow up in the office is for 12-13-14.

## 2014-11-24 NOTE — Telephone Encounter (Signed)
Patient had surgery by Dr.Byrnett on 10/12/14 and was put on antibiotics, she now thinks it has called her a urinary tract infection.

## 2014-11-25 NOTE — Telephone Encounter (Signed)
Notified patient as instructed,she states the yogurt and OTC product seems to be helping. Discussed follow-up appointments, patient agrees

## 2014-11-25 NOTE — Telephone Encounter (Signed)
OK to return to work 

## 2014-12-13 ENCOUNTER — Ambulatory Visit: Payer: 59 | Admitting: General Surgery

## 2015-01-04 ENCOUNTER — Ambulatory Visit (INDEPENDENT_AMBULATORY_CARE_PROVIDER_SITE_OTHER): Payer: 59 | Admitting: General Surgery

## 2015-01-04 ENCOUNTER — Encounter: Payer: Self-pay | Admitting: General Surgery

## 2015-01-04 VITALS — BP 140/80 | Ht 70.0 in | Wt 220.0 lb

## 2015-01-04 DIAGNOSIS — L732 Hidradenitis suppurativa: Secondary | ICD-10-CM | POA: Diagnosis not present

## 2015-01-04 MED ORDER — SULFAMETHOXAZOLE-TRIMETHOPRIM 800-160 MG PO TABS: 1.0000 | ORAL_TABLET | Freq: Two times a day (BID) | ORAL | Status: DC

## 2015-01-04 NOTE — Patient Instructions (Signed)
The patient is aware to call back for any questions or concerns.  

## 2015-01-04 NOTE — Progress Notes (Signed)
Patient ID: Carla Payne, female   DOB: Mar 13, 1985, 29 y.o.   MRN: [id number]  Chief Complaint  Patient presents with  . Follow-up    HPI Carla Payne is a 29 y.o. female.  here today for her follow up hidrademitis excision done on 10/12/14. She states she has noticed more swelling since she has went back to work. She did notice some drainage last week from the right side. Drainage now resolved and the patient is symptom-free.  I personally reviewed the patient's history. HPI  Past Medical History  Diagnosis Date  . Hyperlipidemia   . Pilonidal cyst 2012  . Diabetes mellitus without complication ([place]) 123456  . Hidradenitis 2014  . Anxiety     Past Surgical History  Procedure Laterality Date  . Axillary mass Left 2014 ,may  . Axillary hidradenitis excision  01-05-13  . Perineal hidradenitis excision    . Hydradenitis excision Bilateral 10/12/2014    Procedure: EXCISION HIDRADENITIS GROIN;  Surgeon: Robert Bellow, MD;  Location: [facility] ORS;  Service: General;  Laterality: Bilateral;    No family history on file.  Social History Social History  Substance Use Topics  . Smoking status: Current Every Day Smoker -- 0.25 packs/day for 10 years    Types: Cigarettes  . Smokeless tobacco: Never Used  . Alcohol Use: Yes     Comment: wine on weekends    No Known Allergies  Current Outpatient Prescriptions  Medication Sig Dispense Refill  . docusate sodium (COLACE) 100 MG capsule Take 1 capsule (100 mg total) by mouth 2 (two) times daily. 30 capsule 0  . glipiZIDE (GLUCOTROL XL) 10 MG 24 hr tablet Take 20 mg by mouth daily with breakfast.    . medroxyPROGESTERone (DEPO-PROVERA) 150 MG/ML injection 1 injection IM every 3 months.    . metFORMIN (GLUCOPHAGE) 1000 MG tablet Take 1,000 mg by mouth daily with breakfast.     . mupirocin cream (BACTROBAN) 2 % Apply to affected area 3 times daily 30 g 0  . simvastatin (ZOCOR) 10 MG tablet Take 10 mg by mouth daily at 6 PM.     .  valACYclovir (VALTREX) 1000 MG tablet Take 500 mg by mouth as needed.     . sulfamethoxazole-trimethoprim (BACTRIM DS,SEPTRA DS) 800-160 MG tablet Take 1 tablet by mouth 2 (two) times daily. 20 tablet 0   No current facility-administered medications for this visit.    Review of Systems Review of Systems  Constitutional: Negative.   Respiratory: Negative.   Cardiovascular: Negative.     Blood pressure 140/80, height 5\' 10"  (1.778 m), weight 220 lb (99.791 kg).  Physical Exam Physical Exam  Constitutional: She is oriented to person, place, and time. She appears well-developed and well-nourished.  Abdominal:    Genitourinary:  Left and Right incisions healed well.   Neurological: She is alert and oriented to person, place, and time.  Skin: Skin is warm and dry.  Psychiatric: Her behavior is normal.      Assessment    Symptoms suggestive of recurrent cystic infection without obvious source at this time.    Plan    The patient will be given a prescription for Bactrim DS to use 1 by mouth twice a day if she develops recurrent inflammatory process. She's been asked to call promptly if this does occur for early assessment.    She will have a RX to have on hand is symptoms flare. Follow up as needed.  PCP:  Lamonte Sakai S This  information has been scribed by Karie Fetch RNBC.   Robert Bellow 01/05/2015, 9:20 PM

## 2015-08-24 ENCOUNTER — Ambulatory Visit (INDEPENDENT_AMBULATORY_CARE_PROVIDER_SITE_OTHER): Payer: 59 | Admitting: General Surgery

## 2015-08-24 ENCOUNTER — Encounter: Payer: Self-pay | Admitting: General Surgery

## 2015-08-24 VITALS — BP 140/80 | HR 86 | Temp 98.5°F | Resp 14 | Ht 69.0 in | Wt 221.0 lb

## 2015-08-24 DIAGNOSIS — L02412 Cutaneous abscess of left axilla: Secondary | ICD-10-CM | POA: Insufficient documentation

## 2015-08-24 DIAGNOSIS — L732 Hidradenitis suppurativa: Secondary | ICD-10-CM

## 2015-08-24 NOTE — Patient Instructions (Addendum)
The patient is aware to call back for any questions or concerns. Warm compresses to the area

## 2015-08-24 NOTE — Progress Notes (Signed)
Patient ID: Carla Payne, female   DOB: 01-08-1986, 30 y.o.   MRN: [id number]  Chief Complaint  Patient presents with  . Other    hidradenitis    HPI Carla Payne is a 30 y.o. female who is here today for evaluation of left axillary hidradenitis flare. The left axilla is not draining but is exquisitely tender two days ago. She was started on an antibiotic, doxycycline, by her PCP. She is s/p excision of hidradenitis on the right side and bilateral groin areas.  I have reviewed the history of present illness with the patient.  HPI  Past Medical History:  Diagnosis Date  . Anxiety   . Diabetes mellitus without complication ([place]) 123456  . Hidradenitis 2014  . Hyperlipidemia   . Pilonidal cyst 2012    Past Surgical History:  Procedure Laterality Date  . AXILLARY HIDRADENITIS EXCISION  01-05-13  . axillary mass Left 2014 ,may  . HYDRADENITIS EXCISION Bilateral 10/12/2014   Procedure: EXCISION HIDRADENITIS GROIN;  Surgeon: Robert Bellow, MD;  Location: [facility] ORS;  Service: General;  Laterality: Bilateral;  . PERINEAL HIDRADENITIS EXCISION      No family history on file.  Social History Social History  Substance Use Topics  . Smoking status: Current Every Day Smoker    Packs/day: 0.25    Years: 10.00    Types: Cigarettes  . Smokeless tobacco: Never Used  . Alcohol use Yes     Comment: wine on weekends    No Known Allergies  Current Outpatient Prescriptions  Medication Sig Dispense Refill  . doxycycline (VIBRA-TABS) 100 MG tablet     . fluconazole (DIFLUCAN) 150 MG tablet     . glipiZIDE (GLUCOTROL XL) 10 MG 24 hr tablet Take 20 mg by mouth daily with breakfast.    . medroxyPROGESTERone (DEPO-PROVERA) 150 MG/ML injection 1 injection IM every 3 months.    . metFORMIN (GLUCOPHAGE) 1000 MG tablet Take 1,000 mg by mouth daily with breakfast.     . oxyCODONE-acetaminophen (PERCOCET/ROXICET) 5-325 MG tablet     . simvastatin (ZOCOR) 10 MG tablet Take 10 mg by mouth  daily at 6 PM.     . valACYclovir (VALTREX) 1000 MG tablet Take 500 mg by mouth as needed.      No current facility-administered medications for this visit.     Review of Systems Review of Systems  Constitutional: Positive for chills.  Respiratory: Negative.   Cardiovascular: Negative.     Blood pressure 140/80, pulse 86, temperature 98.5 F (36.9 C), temperature source Oral, resp. rate 14, height 5\' 9"  (1.753 m), weight 221 lb (100.2 kg).  Physical Exam Physical Exam  Constitutional: She is oriented to person, place, and time. She appears well-developed and well-nourished.  Neurological: She is alert and oriented to person, place, and time.  Skin: Skin is warm and dry. There is erythema (left axilla and medial aspect of upper arm).     Psychiatric: Her behavior is normal.    Data Reviewed Prior notes  Assessment    Left axillary hidradenitis superativa with abscess - abscess spontaneously began draining when the arm was lifted. Approximately 35mL of pus followed by some blood was evacuated from the area without significant intervention. Wound was dressed with gauze.  C/S sample sent.  Plan    Continue doxycycline as prescribed.   Patient instructed to use warm compresses on the area to assist in drainage and inflammation. Return precautions provided  This information has been scribed by Karie Fetch  RN, BSN,BC.   Kaston Faughn G 08/24/2015, 12:39 PM

## 2015-08-29 LAB — ANAEROBIC AND AEROBIC CULTURE

## 2015-09-01 ENCOUNTER — Ambulatory Visit (INDEPENDENT_AMBULATORY_CARE_PROVIDER_SITE_OTHER): Payer: 59 | Admitting: General Surgery

## 2015-09-01 ENCOUNTER — Encounter: Payer: Self-pay | Admitting: General Surgery

## 2015-09-01 VITALS — BP 164/94 | HR 90 | Temp 99.0°F | Resp 14 | Ht 69.0 in | Wt 225.0 lb

## 2015-09-01 DIAGNOSIS — L732 Hidradenitis suppurativa: Secondary | ICD-10-CM | POA: Diagnosis not present

## 2015-09-01 DIAGNOSIS — L02411 Cutaneous abscess of right axilla: Secondary | ICD-10-CM

## 2015-09-01 MED ORDER — SULFAMETHOXAZOLE-TRIMETHOPRIM 800-160 MG PO TABS: 1.0000 | ORAL_TABLET | Freq: Two times a day (BID) | ORAL | 0 refills | Status: DC

## 2015-09-01 MED ORDER — FLUCONAZOLE 150 MG PO TABS: 150.0000 mg | ORAL_TABLET | Freq: Every day | ORAL | 0 refills | Status: DC

## 2015-09-01 NOTE — Patient Instructions (Signed)
The patient is aware to call back for any questions or concerns.  

## 2015-09-01 NOTE — Progress Notes (Addendum)
Patient ID: Carla Payne, female   DOB: 08-10-1985, 30 y.o.   MRN: [id number]  Chief Complaint  Patient presents with  . Routine Post Op    HPI Carla Payne is a 30 y.o. female.  Here for follow up hidradenitis left axilla. She states the left side is still drainage some. The right side has started swelling more and is painful. She has 3 antibiotic tablets left. She states the groin areas are fine. She saw Dr Jamal Collin last week for the left axilla. FSBS average 130.She reports her sugars her back to baseline, markedly high prior to the left axillary abscess drainage last week.  HPI  Past Medical History:  Diagnosis Date  . Anxiety   . Diabetes mellitus without complication ([place]) 123456  . Hidradenitis 2014  . Hyperlipidemia   . Pilonidal cyst 2012    Past Surgical History:  Procedure Laterality Date  . AXILLARY HIDRADENITIS EXCISION  01-05-13  . axillary mass Left 2014 ,may  . HYDRADENITIS EXCISION Bilateral 10/12/2014   Procedure: EXCISION HIDRADENITIS GROIN;  Surgeon: Robert Bellow, MD;  Location: [facility] ORS;  Service: General;  Laterality: Bilateral;  . PERINEAL HIDRADENITIS EXCISION      No family history on file.  Social History Social History  Substance Use Topics  . Smoking status: Current Every Day Smoker    Packs/day: 0.25    Years: 10.00    Types: Cigarettes  . Smokeless tobacco: Never Used  . Alcohol use Yes     Comment: wine on weekends    No Known Allergies  Current Outpatient Prescriptions  Medication Sig Dispense Refill  . doxycycline (VIBRA-TABS) 100 MG tablet     . fluconazole (DIFLUCAN) 150 MG tablet     . glipiZIDE (GLUCOTROL XL) 10 MG 24 hr tablet Take 20 mg by mouth daily with breakfast.    . medroxyPROGESTERone (DEPO-PROVERA) 150 MG/ML injection 1 injection IM every 3 months.    . metFORMIN (GLUCOPHAGE) 1000 MG tablet Take 1,000 mg by mouth daily with breakfast.     . simvastatin (ZOCOR) 10 MG tablet Take 10 mg by mouth daily at 6 PM.      . valACYclovir (VALTREX) 1000 MG tablet Take 500 mg by mouth as needed.     . fluconazole (DIFLUCAN) 150 MG tablet Take 1 tablet (150 mg total) by mouth daily. Take on day 1 and day 7 2 tablet 0  . sulfamethoxazole-trimethoprim (BACTRIM DS,SEPTRA DS) 800-160 MG tablet Take 1 tablet by mouth 2 (two) times daily. 28 tablet 0   No current facility-administered medications for this visit.     Review of Systems Review of Systems  Constitutional: Negative.   Respiratory: Negative.   Cardiovascular: Negative.     Blood pressure (!) 164/94, pulse 90, temperature 99 F (37.2 C), temperature source Oral, resp. rate 14, height 5\' 9"  (1.753 m), weight 225 lb (102.1 kg).  Physical Exam Physical Exam  Constitutional: She is oriented to person, place, and time. She appears well-developed and well-nourished.  HENT:  Mouth/Throat: Oropharynx is clear and moist.  Eyes: Conjunctivae are normal. No scleral icterus.  Neck: Neck supple.  Cardiovascular: Normal rate, regular rhythm and normal heart sounds.   Pulmonary/Chest: Effort normal and breath sounds normal.    Lymphadenopathy:    She has no cervical adenopathy.  Neurological: She is alert and oriented to person, place, and time.  Skin: Skin is warm and dry.  3 x 7 area of fullness right axillary area. Left axilla irritated  with open areas.  Psychiatric: Her behavior is normal.    Data Reviewed Culture results from the left axilla showed mixed flora.  Assessment    Current hidradenitis of the right axilla, marked improvement and left axillary inflammation status post drainage on last visit.    Plan    Patient would probably best managed by operative debridement, but declines at present. With the marked swelling in the right axilla was elected to proceed to incision and drainage. 20 mL of 0.5% Xylocaine with 0.25% Marcaine with 1-200,000 of epinephrine was used after prepping of the skin with ChloraPrep. The area was reprepped and an  ellipse of skin measuring 5 x 30 mm was excised. Proximal and 30 mL of purulent fluid was obtained. The cavity was irrigated with peroxide. Scant bleeding was noted. A dry dressing was applied.  We'll change from doxycycline to Bactrim. Patient is well versed on wound care. A prescription for Norco, #30 with the inscription 1-2 by mouth every 4 hours when necessary for pain with no refills was provided.  As the patient has frequent vaginal symptoms with antibiotic management, a prescription for Diflucan has been provided to use if needed. She has been encouraged to make use of the overt.     Follow up in 2 weeks Bactrim DS #28 Diflucan #2 Norco #30  This information has been scribed by Karie Fetch RN, BSN,BC.   Robert Bellow 09/02/2015, 7:13 PM

## 2015-09-02 DIAGNOSIS — L02411 Cutaneous abscess of right axilla: Secondary | ICD-10-CM | POA: Insufficient documentation

## 2015-09-07 ENCOUNTER — Ambulatory Visit: Payer: 59 | Admitting: General Surgery

## 2015-09-14 ENCOUNTER — Ambulatory Visit (INDEPENDENT_AMBULATORY_CARE_PROVIDER_SITE_OTHER): Payer: 59 | Admitting: General Surgery

## 2015-09-14 ENCOUNTER — Encounter: Payer: Self-pay | Admitting: General Surgery

## 2015-09-14 VITALS — BP 124/82 | HR 78 | Resp 12 | Ht 68.0 in | Wt 222.0 lb

## 2015-09-14 DIAGNOSIS — L732 Hidradenitis suppurativa: Secondary | ICD-10-CM | POA: Diagnosis not present

## 2015-09-14 MED ORDER — IBUPROFEN 200 MG PO TABS: 800.0000 mg | ORAL_TABLET | Freq: Three times a day (TID) | ORAL | 1 refills | Status: DC | PRN

## 2015-09-14 NOTE — Progress Notes (Signed)
Patient ID: Carla Payne, female   DOB: [birth year], 30 y.o.   MRN: [id number]  Chief Complaint  Patient presents with  . Follow-up    hidradenitis    HPI Carla Payne is a 30 y.o. female here today for her two week follow up hidradenitis. Right axilla is doing well and the left axilla is still draining.  HPI  Past Medical History:  Diagnosis Date  . Anxiety   . Diabetes mellitus without complication ([place]) 123456  . Hidradenitis 2014  . Hyperlipidemia   . Pilonidal cyst 2012    Past Surgical History:  Procedure Laterality Date  . AXILLARY HIDRADENITIS EXCISION  01-05-13  . axillary mass Left 2014 ,may  . HYDRADENITIS EXCISION Bilateral 10/12/2014   Procedure: EXCISION HIDRADENITIS GROIN;  Surgeon: Robert Bellow, MD;  Location: [facility] ORS;  Service: General;  Laterality: Bilateral;  . PERINEAL HIDRADENITIS EXCISION      No family history on file.  Social History Social History  Substance Use Topics  . Smoking status: Current Every Day Smoker    Packs/day: 0.25    Years: 10.00    Types: Cigarettes  . Smokeless tobacco: Never Used  . Alcohol use Yes     Comment: wine on weekends    No Known Allergies  Current Outpatient Prescriptions  Medication Sig Dispense Refill  . fluconazole (DIFLUCAN) 150 MG tablet Take 1 tablet (150 mg total) by mouth daily. Take on day 1 and day 7 2 tablet 0  . glipiZIDE (GLUCOTROL XL) 10 MG 24 hr tablet Take 20 mg by mouth daily with breakfast.    . medroxyPROGESTERone (DEPO-PROVERA) 150 MG/ML injection 1 injection IM every 3 months.    . metFORMIN (GLUCOPHAGE) 1000 MG tablet Take 1,000 mg by mouth daily with breakfast.     . simvastatin (ZOCOR) 10 MG tablet Take 10 mg by mouth daily at 6 PM.     . sulfamethoxazole-trimethoprim (BACTRIM DS,SEPTRA DS) 800-160 MG tablet Take 1 tablet by mouth 2 (two) times daily. 28 tablet 0  . valACYclovir (VALTREX) 1000 MG tablet Take 500 mg by mouth as needed.     Marland Kitchen ibuprofen (MOTRIN IB) 200 MG tablet  Take 4 tablets (800 mg total) by mouth 3 (three) times daily as needed for moderate pain. 240 tablet 1   No current facility-administered medications for this visit.     Review of Systems Review of Systems  Blood pressure 124/82, pulse 78, resp. rate 12, height 5\' 8"  (1.727 m), weight 222 lb (100.7 kg).  Physical Exam Physical Exam  Constitutional: She is oriented to person, place, and time. She appears well-developed and well-nourished.  Pulmonary/Chest:    Lymphadenopathy:  Right axilla resolved and left axilla is still draining.   Neurological: She is alert and oriented to person, place, and time.  Skin: Skin is warm and dry.    Data Reviewed Culture of 08/24/2015 showed light growth of skin fluoroscopy.  Assessment    Hidradenitis left axilla.    Plan    I think the patient requires operative excision but she is reluctant to proceed. I think she is obtained maximum benefit from antibiotic therapy. She has been asked to continue to use local heat.   Patient to return as needed.  This information has been scribed by Gaspar Cola CMA.   Robert Bellow 09/16/2015, 3:42 PM

## 2015-09-14 NOTE — Patient Instructions (Addendum)
Return as needed.The patient is aware to use a heating pad as needed for comfort. 

## 2015-10-31 ENCOUNTER — Encounter: Payer: Self-pay | Admitting: Internal Medicine

## 2015-10-31 ENCOUNTER — Inpatient Hospital Stay: Payer: 59 | Attending: Internal Medicine | Admitting: Internal Medicine

## 2015-10-31 VITALS — BP 128/92 | Temp 97.5°F | Resp 18 | Ht 68.0 in | Wt 222.8 lb

## 2015-10-31 DIAGNOSIS — F17210 Nicotine dependence, cigarettes, uncomplicated: Secondary | ICD-10-CM | POA: Insufficient documentation

## 2015-10-31 DIAGNOSIS — D72820 Lymphocytosis (symptomatic): Secondary | ICD-10-CM | POA: Diagnosis present

## 2015-10-31 DIAGNOSIS — F419 Anxiety disorder, unspecified: Secondary | ICD-10-CM | POA: Insufficient documentation

## 2015-10-31 DIAGNOSIS — E785 Hyperlipidemia, unspecified: Secondary | ICD-10-CM | POA: Insufficient documentation

## 2015-10-31 DIAGNOSIS — D473 Essential (hemorrhagic) thrombocythemia: Secondary | ICD-10-CM | POA: Diagnosis not present

## 2015-10-31 DIAGNOSIS — Z794 Long term (current) use of insulin: Secondary | ICD-10-CM

## 2015-10-31 DIAGNOSIS — Z79899 Other long term (current) drug therapy: Secondary | ICD-10-CM | POA: Diagnosis not present

## 2015-10-31 DIAGNOSIS — E1165 Type 2 diabetes mellitus with hyperglycemia: Secondary | ICD-10-CM | POA: Insufficient documentation

## 2015-10-31 NOTE — Assessment & Plan Note (Addendum)
#   Mild lymphocytosis/thrombocytosis- question chronic unclear etiology. Recommend checking CBC CMP and LDH; peripheral blood flow cytometry.    # Smoker/Poorly controlled DM/ Hidradenitis  # Patient a previous hematology consultation at [facility] hematology & Tele-Oncology- will try to get records. Left message to get records/wor up from previous hematology consultation.  # recommend follow up in 3 months; labs based on the work up available from previous hematology evaluation.   Thank you Dr.McClean for allowing me to participate in the care of your pleasant patient. Please do not hesitate to contact me with questions or concerns in the interim.  Addendum:  Flow cytometry was done- negative any significant immunophenotypic abnormalities; BCR ABL quantitative negative. I suspect reactive/secondary to smoking. For now I recommend checking a CBC in 3 months.

## 2015-10-31 NOTE — Progress Notes (Signed)
Pt complains of frequent urination and irritation from where she is wiping.  Pt complains of a non healing cyst under left arm.

## 2015-10-31 NOTE — Progress Notes (Signed)
[facility] CONSULT NOTE  Patient Care Team: Bevelyn Buckles, MD as PCP - General (Internal Medicine) [name], MD (General Surgery) Margaretann Loveless, MD (Internal Medicine)  CHIEF COMPLAINTS/PURPOSE OF CONSULTATION:   # LEUCOCYTOSIS/THROMBOCYTOSIS [WBC- 12.6/ALC- 4.9; platelets- 417-N= 379] [previous eval at [facility] Hemat[facility]; [place]]  # Poorly controlled DM-2;    No history exists.     HISTORY OF PRESENTING ILLNESS:  Carla Payne 30 y.o.  female patient with previous history of poorly controlled diabetes [most recent hemoglobin A1c 9]- has been referred to [place] for further evaluation/workup for chronic leukocytosis/thrombocytosis.  Patient denies any frequent fevers or chills. Denies any weight loss. Negative night sweats. She has had problems with hydradenitis- needing drainage. She is awaiting to see a surgeon for drainage soon.  Denies any abdominal pain nausea vomiting. No chest pain or shortness of breath or cough. Patient also was evaluated by a hematologist approximately 4 months ago- told to have a benign condition. She did not have any bone marrow biopsy.   ROS: A complete 10 point review of system is done which is negative except mentioned above in history of present illness  MEDICAL HISTORY:  Past Medical History:  Diagnosis Date  . Anxiety   . Diabetes mellitus without complication (H[facility]) 2014  . Hidradenitis 2014  . Hyperlipidemia   . Pilonidal cyst 2012    SURGICAL HISTORY: Past Surgical History:  Procedure Laterality Date  . AXILLARY HIDRADENITIS EXCISION  01-05-13  . axillary mass Left 2014 ,may  . HYDRADENITIS EXCISION Bilateral 10/12/2014   Procedure: EXCISION HIDRADENITIS GROIN;  Surgeon: [name], MD;  Location: [facility] ORS;  Service: General;  Laterality: Bilateral;  . 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  Marital status: Single    Spouse name: N/A  . Number of children: N/A  . Years of education: N/A   Occupational History  . Not on file.   Social History Main Topics  . Smoking status: Current Every Day Smoker    Packs/day: 0.50    Years: 13.00    Types: Cigarettes  . Smokeless tobacco: Never Used  . Alcohol use Yes     Comment: wine on weekends  . Drug use: No  . Sexual activity: Not on file   Other Topics Concern  . Not on file   Social History Narrative  . No narrative on file    FAMILY HISTORY: no cancers in family.  Family History  Problem Relation Age of Onset  . Hypertension Father     ALLERGIES:  has No Known Allergies.  MEDICATIONS:  Current Outpatient Prescriptions  Medication Sig Dispense Refill  . glipiZIDE (GLUCOTROL XL) 10 MG 24 hr tablet Take 20 mg by mouth daily with breakfast.    . HUMALOG KWIKPEN 100 UNIT/ML KiwkPen     . ibuprofen (ADVIL,MOTRIN) 800 MG tablet     . medroxyPROGESTERone (DEPO-PROVERA) 150 MG/ML injection 1 injection IM every 3 months.    . metFORMIN (GLUCOPHAGE) 1000 MG tablet Take 1,000 mg by mouth daily with breakfast.     . simvastatin (ZOCOR) 10 MG tablet Take 10 mg by mouth daily at 6 PM.     . valACYclovir (VALTREX) 1000 MG tablet Take 500 mg by mouth as needed.     . fluconazole (DIFLUCAN) 150 MG tablet Take 1 tablet (150 mg total) by mouth daily. Take on  day 1 and day 7 (Patient not taking: Reported on 10/31/2015) 2 tablet 0   No current facility-administered medications for this visit.       Marland Kitchen  PHYSICAL EXAMINATION: ECOG PERFORMANCE STATUS: 0 - Asymptomatic  Vitals:   10/31/15 1512  BP: (!) 128/92  Resp: 18  Temp: 97.5 F (36.4 C)   Filed Weights   10/31/15 1512  Weight: 222 lb 12.8 oz (101.1 kg)    GENERAL: Well-nourished well-developed; Alert, no distress and comfortable.   Alone, EYES: no pallor or icterus OROPHARYNX: no thrush or ulceration; good dentition  NECK: supple, no masses felt LYMPH:  no  palpable lymphadenopathy in the cervical, axillary or inguinal regions LUNGS: clear to auscultation and  No wheeze or crackles HEART/CVS: regular rate & rhythm and no murmurs; No lower extremity edema ABDOMEN: abdomen soft, non-tender and normal bowel sounds Musculoskeletal:no cyanosis of digits and no clubbing  PSYCH: alert & oriented x 3 with fluent speech NEURO: no focal motor/sensory deficits SKIN:  no rashes or significant lesions  LABORATORY DATA:  I have reviewed the data as listed No results found for: WBC, HGB, HCT, MCV, PLT No results for input(s): NA, K, CL, CO2, GLUCOSE, BUN, CREATININE, CALCIUM, GFRNONAA, GFRAA, PROT, ALBUMIN, AST, ALT, ALKPHOS, BILITOT, BILIDIR, IBILI in the last 8760 hours.  RADIOGRAPHIC STUDIES: I have personally reviewed the radiological images as listed and agreed with the findings in the report. No results found.  ASSESSMENT & PLAN:   Lymphocytosis # Mild lymphocytosis/thrombocytosis- question chronic unclear etiology. Recommend checking CBC CMP and LDH; peripheral blood flow cytometry.    # Smoker/Poorly controlled DM/ Hidradenitis  # Patient a previous hematology consultation at [facility] hematology & Tele-Oncology- will try to get records. Left message to get records/wor up from previous hematology consultation.  # recommend follow up in 3 months; labs based on the work up available from previous hematology evaluation.   Thank you Dr.McClean for allowing me to participate in the care of your pleasant patient. Please do not hesitate to contact me with questions or concerns in the interim.   All questions were answered. The patient knows to call the clinic with any problems, questions or concerns.     Cammie Sickle, MD 10/31/2015 4:00 PM

## 2015-11-03 ENCOUNTER — Telehealth: Payer: Self-pay

## 2015-11-03 NOTE — Telephone Encounter (Signed)
Called and spoke with pt regarding Dr. B would like to have pt to have labs done in 3 months at [place].  Per pt she would like the requisition mailed to her mailed to her.  I voiced I put them in the mail today.  No other concerns noted.

## 2016-01-05 ENCOUNTER — Other Ambulatory Visit: Payer: Self-pay | Admitting: Internal Medicine

## 2016-01-05 DIAGNOSIS — R945 Abnormal results of liver function studies: Secondary | ICD-10-CM

## 2016-01-11 ENCOUNTER — Ambulatory Visit
Admission: RE | Admit: 2016-01-11 | Discharge: 2016-01-11 | Disposition: A | Discharge status: Home / Self Care | Payer: 59 | Source: Ambulatory Visit | Attending: Internal Medicine | Admitting: Internal Medicine

## 2016-01-11 DIAGNOSIS — R945 Abnormal results of liver function studies: Secondary | ICD-10-CM | POA: Diagnosis not present

## 2016-01-11 DIAGNOSIS — K760 Fatty (change of) liver, not elsewhere classified: Secondary | ICD-10-CM

## 2016-01-11 DIAGNOSIS — R932 Abnormal findings on diagnostic imaging of liver and biliary tract: Secondary | ICD-10-CM | POA: Insufficient documentation

## 2016-01-11 HISTORY — DX: Fatty (change of) liver, not elsewhere classified: K76.0

## 2016-01-30 ENCOUNTER — Encounter: Payer: Self-pay | Admitting: Internal Medicine

## 2016-02-02 ENCOUNTER — Ambulatory Visit: Payer: 59 | Admitting: Internal Medicine

## 2016-02-03 ENCOUNTER — Inpatient Hospital Stay: Payer: 59 | Admitting: Internal Medicine

## 2016-02-27 ENCOUNTER — Inpatient Hospital Stay: Payer: 59 | Attending: Internal Medicine | Admitting: Internal Medicine

## 2016-02-27 VITALS — BP 140/97 | HR 102 | Temp 98.2°F | Resp 18 | Ht 68.0 in | Wt 224.0 lb

## 2016-02-27 DIAGNOSIS — D473 Essential (hemorrhagic) thrombocythemia: Secondary | ICD-10-CM | POA: Insufficient documentation

## 2016-02-27 DIAGNOSIS — Z7984 Long term (current) use of oral hypoglycemic drugs: Secondary | ICD-10-CM | POA: Diagnosis not present

## 2016-02-27 DIAGNOSIS — F17210 Nicotine dependence, cigarettes, uncomplicated: Secondary | ICD-10-CM | POA: Insufficient documentation

## 2016-02-27 DIAGNOSIS — F419 Anxiety disorder, unspecified: Secondary | ICD-10-CM | POA: Diagnosis not present

## 2016-02-27 DIAGNOSIS — Z79899 Other long term (current) drug therapy: Secondary | ICD-10-CM | POA: Diagnosis not present

## 2016-02-27 DIAGNOSIS — E1165 Type 2 diabetes mellitus with hyperglycemia: Secondary | ICD-10-CM

## 2016-02-27 DIAGNOSIS — E785 Hyperlipidemia, unspecified: Secondary | ICD-10-CM | POA: Insufficient documentation

## 2016-02-27 DIAGNOSIS — D72820 Lymphocytosis (symptomatic): Secondary | ICD-10-CM

## 2016-02-27 NOTE — Progress Notes (Signed)
[place] NOTE  Patient Care Team: Delorise Jackson, MD as PCP - General (Internal Medicine) Robert Bellow, MD (General Surgery) Perrin Maltese, MD (Internal Medicine)  CHIEF COMPLAINTS/PURPOSE OF CONSULTATION:   # LEUCOCYTOSIS/THROMBOCYTOSIS [WBC- 12.6/ALC- 4.9; platelets- 417-N= 379] [previous eval at [facility]; [place]]  # Poorly controlled DM-2;    No history exists.     HISTORY OF PRESENTING ILLNESS:  Carla Payne 31 y.o.  female patient with previous history Leukocytosis/thrombocytosis is here for follow-up.  Denies any blood clots. Denies any unusual fevers or chills. No nausea no vomiting.   ROS: A complete 10 point review of system is done which is negative except mentioned above in history of present illness  MEDICAL HISTORY:  Past Medical History:  Diagnosis Date  . Anxiety   . Diabetes mellitus without complication ([place]) 123456  . Hidradenitis 2014  . Hyperlipidemia   . Pilonidal cyst 2012    SURGICAL HISTORY: Past Surgical History:  Procedure Laterality Date  . AXILLARY HIDRADENITIS EXCISION  01-05-13  . axillary mass Left 2014 ,may  . HYDRADENITIS EXCISION Bilateral 10/12/2014   Procedure: EXCISION HIDRADENITIS GROIN;  Surgeon: Robert Bellow, MD;  Location: [facility] ORS;  Service: General;  Laterality: Bilateral;  . 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 Marital status: Single    Spouse name: N/A  . Number of children: N/A  . Years of education: N/A   Occupational History  . Not on file.   Social History Main Topics  . Smoking status: Current Every Day Smoker    Packs/day: 0.50    Years: 13.00    Types: Cigarettes  . Smokeless tobacco: Never Used  . Alcohol use Yes     Comment: wine on weekends  . Drug use: No  . Sexual activity: Not on file   Other Topics Concern  . Not on file   Social  History Narrative  . No narrative on file    FAMILY HISTORY: no cancers in family.  Family History  Problem Relation Age of Onset  . Hypertension Father     ALLERGIES:  has No Known Allergies.  MEDICATIONS:  Current Outpatient Prescriptions  Medication Sig Dispense Refill  . glipiZIDE (GLUCOTROL XL) 2.5 MG 24 hr tablet Take 2.5 mg by mouth daily with breakfast.    . HUMALOG KWIKPEN 100 UNIT/ML KiwkPen Inject into the skin. As needed when glucose levels are 130 (based on sliding dosing)    . ibuprofen (ADVIL,MOTRIN) 800 MG tablet     . medroxyPROGESTERone (DEPO-PROVERA) 150 MG/ML injection 1 injection IM every 3 months.    . simvastatin (ZOCOR) 10 MG tablet Take 10 mg by mouth daily at 6 PM.     . SitaGLIPtin-MetFORMIN HCl (JANUMET XR) [phone] MG TB24 Take 1 tablet by mouth daily.    . fluconazole (DIFLUCAN) 150 MG tablet Take 1 tablet (150 mg total) by mouth daily. Take on day 1 and day 7 (Patient not taking: Reported on 10/31/2015) 2 tablet 0   No current facility-administered medications for this visit.       Marland Kitchen  PHYSICAL EXAMINATION: ECOG PERFORMANCE STATUS: 0 - Asymptomatic  Vitals:   02/27/16 1512  BP: (!) 140/97  Pulse: (!) 102  Resp: 18  Temp: 98.2 F (36.8 C)   Filed Weights   02/27/16 1512  Weight: 224 lb (101.6 kg)  GENERAL: Well-nourished well-developed; Alert, no distress and comfortable.   Alone, EYES: no pallor or icterus OROPHARYNX: no thrush or ulceration; good dentition  NECK: supple, no masses felt LYMPH:  no palpable lymphadenopathy in the cervical, axillary or inguinal regions LUNGS: clear to auscultation and  No wheeze or crackles HEART/CVS: regular rate & rhythm and no murmurs; No lower extremity edema ABDOMEN: abdomen soft, non-tender and normal bowel sounds Musculoskeletal:no cyanosis of digits and no clubbing  PSYCH: alert & oriented x 3 with fluent speech NEURO: no focal motor/sensory deficits SKIN:  no rashes or significant  lesions  LABORATORY DATA:  I have reviewed the data as listed No results found for: WBC, HGB, HCT, MCV, PLT No results for input(s): NA, K, CL, CO2, GLUCOSE, BUN, CREATININE, CALCIUM, GFRNONAA, GFRAA, PROT, ALBUMIN, AST, ALT, ALKPHOS, BILITOT, BILIDIR, IBILI in the last 8760 hours.  RADIOGRAPHIC STUDIES: I have personally reviewed the radiological images as listed and agreed with the findings in the report. No results found.  ASSESSMENT & PLAN:   Lymphocytosis # Mild lymphocytosis/thrombocytosis- question chronic unclear etiology. Review of previous blood work including peripheral blood flowcytometry for BCR ABL negative [previous hematology consultation]. Review of blood work from today shows normal white count/ [neutrophils/lymphocytes].   # Mild thrombocytosis platelets of 4:30 or so- likely reactive. I would not recommend any further workup at this time unless the platelets continue to trend above 500-600  On a consistent basis.   # no further follow up as needed here in the clinic. Discussed that she will continue to follow up with PCP.    CC: Morrison Old  All questions were answered. The patient knows to call the clinic with any problems, questions or concerns.    Cammie Sickle, MD 02/27/2016 4:48 PM

## 2016-02-27 NOTE — Progress Notes (Signed)
Patient here for follow-up for leukocytosis. Recently tx for the influenza virus prophylactically as mult. Family members in her home had the flu. She had no influenza symptoms. She has no medical complaints.

## 2016-02-27 NOTE — Assessment & Plan Note (Addendum)
#   Mild lymphocytosis/thrombocytosis- question chronic unclear etiology. Review of previous blood work including peripheral blood flowcytometry for BCR ABL negative [previous hematology consultation]. Review of blood work from today shows normal white count/ [neutrophils/lymphocytes].   # Mild thrombocytosis platelets of 4:30 or so- likely reactive. I would not recommend any further workup at this time unless the platelets continue to trend above 500-600  On a consistent basis.   # no further follow up as needed here in the clinic. Discussed that she will continue to follow up with PCP.    CC: Morrison Old

## 2016-03-20 ENCOUNTER — Encounter: Payer: Self-pay | Admitting: *Deleted

## 2016-03-20 ENCOUNTER — Emergency Department
Admission: EM | Admit: 2016-03-20 | Discharge: 2016-03-20 | Disposition: A | Discharge status: Home / Self Care | Payer: 59 | Attending: Emergency Medicine | Admitting: Emergency Medicine

## 2016-03-20 DIAGNOSIS — Z794 Long term (current) use of insulin: Secondary | ICD-10-CM | POA: Insufficient documentation

## 2016-03-20 DIAGNOSIS — L02412 Cutaneous abscess of left axilla: Secondary | ICD-10-CM

## 2016-03-20 DIAGNOSIS — E119 Type 2 diabetes mellitus without complications: Secondary | ICD-10-CM | POA: Insufficient documentation

## 2016-03-20 DIAGNOSIS — F17210 Nicotine dependence, cigarettes, uncomplicated: Secondary | ICD-10-CM | POA: Diagnosis not present

## 2016-03-20 MED ORDER — LIDOCAINE HCL (PF) 1 % IJ SOLN
INTRAMUSCULAR | Status: AC
  Filled 2016-03-20: qty 5

## 2016-03-20 MED ORDER — OXYCODONE-ACETAMINOPHEN 7.5-325 MG PO TABS: 1.0000 | ORAL_TABLET | Freq: Four times a day (QID) | ORAL | 0 refills | Status: DC | PRN

## 2016-03-20 MED ORDER — SULFAMETHOXAZOLE-TRIMETHOPRIM 800-160 MG PO TABS: 1.0000 | ORAL_TABLET | Freq: Two times a day (BID) | ORAL | 0 refills | Status: DC

## 2016-03-20 MED ORDER — OXYCODONE-ACETAMINOPHEN 5-325 MG PO TABS
2.0000 | ORAL_TABLET | Freq: Once | ORAL | Status: AC
  Administered 2016-03-20: 2 via ORAL
  Filled 2016-03-20: qty 2

## 2016-03-20 MED ORDER — FLUCONAZOLE 150 MG PO TABS: 150.0000 mg | ORAL_TABLET | Freq: Every day | ORAL | 0 refills | Status: DC

## 2016-03-20 NOTE — ED Notes (Signed)
See triage note  States she developed a swollen area under left arm couple of days

## 2016-03-20 NOTE — ED Provider Notes (Signed)
[facility] Emergency Department Provider Note   ____________________________________________   First MD Initiated Contact with Patient 03/20/16 1011     (approximate)  I have reviewed the triage vital signs and the nursing notes.   HISTORY  Chief Complaint Abscess    HPI Carla Payne is a 31 y.o. female patient complain of a cyst left axillary area. Patient states increased swelling in the last couple days. Patient states she contacted her surgeon in normal this cleared condition but she cannot get appointment until the end of the week. Patient states she cannot wait this long.Patient rates the pain as a 5/10. Patient described a pain as "sharp". No palliative measures for this complaint. Patient has a history of Hidradenitis suppurativa with multiple episodes of incision and drainage bilateral axillary areas.   Past Medical History:  Diagnosis Date  . Anxiety   . Diabetes mellitus without complication ([place]) 123456  . Hidradenitis 2014  . Hyperlipidemia   . Pilonidal cyst 2012    Patient Active Problem List   Diagnosis Date Noted  . Lymphocytosis 10/31/2015  . Abscess of axilla, right 09/02/2015  . Abscess of axilla, left 08/24/2015  . Hidradenitis suppurativa 10/08/2012    Past Surgical History:  Procedure Laterality Date  . AXILLARY HIDRADENITIS EXCISION  01-05-13  . axillary mass Left 2014 ,may  . HYDRADENITIS EXCISION Bilateral 10/12/2014   Procedure: EXCISION HIDRADENITIS GROIN;  Surgeon: Robert Bellow, MD;  Location: [facility] ORS;  Service: General;  Laterality: Bilateral;  . PERINEAL HIDRADENITIS EXCISION      Prior to Admission medications   Medication Sig Start Date End Date Taking? Authorizing Provider  fluconazole (DIFLUCAN) 150 MG tablet Take 1 tablet (150 mg total) by mouth daily. Take on day 1 and day 7 Patient not taking: Reported on 10/31/2015 09/01/15   Robert Bellow, MD  fluconazole (DIFLUCAN) 150 MG tablet Take 1  tablet (150 mg total) by mouth daily. 03/20/16   Sable Feil, PA-C  glipiZIDE (GLUCOTROL XL) 2.5 MG 24 hr tablet Take 2.5 mg by mouth daily with breakfast.    Historical Provider, MD  HUMALOG KWIKPEN 100 UNIT/ML KiwkPen Inject into the skin. As needed when glucose levels are 130 (based on sliding dosing) 10/17/15   Historical Provider, MD  ibuprofen (ADVIL,MOTRIN) 800 MG tablet  10/27/15   Historical Provider, MD  medroxyPROGESTERone (DEPO-PROVERA) 150 MG/ML injection 1 injection IM every 3 months. 12/18/12   Historical Provider, MD  oxyCODONE-acetaminophen (PERCOCET) 7.5-325 MG tablet Take 1 tablet by mouth every 6 (six) hours as needed for severe pain. 03/20/16   Sable Feil, PA-C  simvastatin (ZOCOR) 10 MG tablet Take 10 mg by mouth daily at 6 PM.  09/04/14   Historical Provider, MD  SitaGLIPtin-MetFORMIN HCl (JANUMET XR) [phone] MG TB24 Take 1 tablet by mouth daily.    Historical Provider, MD  sulfamethoxazole-trimethoprim (BACTRIM DS,SEPTRA DS) 800-160 MG tablet Take 1 tablet by mouth 2 (two) times daily. 03/20/16   Sable Feil, PA-C    Allergies Patient has no known allergies.  Family History  Problem Relation Age of Onset  . Hypertension Father     Social History Social History  Substance Use Topics  . Smoking status: Current Every Day Smoker    Packs/day: 0.50    Years: 13.00    Types: Cigarettes  . Smokeless tobacco: Never Used  . Alcohol use Yes     Comment: wine on weekends    Review of Systems Constitutional: No  fever/chills Eyes: No visual changes. ENT: No sore throat. Cardiovascular: Denies chest pain. Respiratory: Denies shortness of breath. Gastrointestinal: No abdominal pain.  No nausea, no vomiting.  No diarrhea.  No constipation. Genitourinary: Negative for dysuria. Musculoskeletal: Negative for back pain. Skin: Negative for rash. Hydradenitis Neurological: Negative for headaches, focal weakness or numbness. Psychiatric:Anxiety. Endocrine:Diabetes and  hyperlipidemia.  ____________________________________________   PHYSICAL EXAM:  VITAL SIGNS: ED Triage Vitals [03/20/16 0949]  Enc Vitals Group     BP (!) 152/99     Pulse Rate 98     Resp 20     Temp 97.8 F (36.6 C)     Temp Source Oral     SpO2 100 %     Weight 220 lb (99.8 kg)     Height 5\' 9"  (1.753 m)     Head Circumference      Peak Flow      Pain Score 5     Pain Loc      Pain Edu?      Excl. in [place]?     Constitutional: Alert and oriented. Well appearing and in no acute distress.Anxious Eyes: Conjunctivae are normal. PERRL. EOMI. Head: Atraumatic. Nose: No congestion/rhinnorhea. Mouth/Throat: Mucous membranes are moist.  Oropharynx non-erythematous. Neck: No stridor.  No cervical spine tenderness to palpation. Hematological/Lymphatic/Immunilogical: No cervical lymphadenopathy. Cardiovascular: Normal rate, regular rhythm. Grossly normal heart sounds.  Good peripheral circulation. Elevated blood pressure Respiratory: Normal respiratory effort.  No retractions. Lungs CTAB. Gastrointestinal: Soft and nontender. No distention. No abdominal bruits. No CVA tenderness. Musculoskeletal: No lower extremity tenderness nor edema.  No joint effusions. Neurologic:  Normal speech and language. No gross focal neurologic deficits are appreciated. No gait instability. Skin:  Skin is warm, dry and intact. No rash noted. Keloid scar tissue from previous incision and drainage bilateral axillary area Psychiatric: Mood and affect are normal. Speech and behavior are normal.  ____________________________________________   LABS (all labs ordered are listed, but only abnormal results are displayed)  Labs Reviewed  AEROBIC CULTURE (SUPERFICIAL SPECIMEN)   ____________________________________________  EKG   ____________________________________________  RADIOLOGY   ____________________________________________   PROCEDURES  Procedure(s) performed: INCISION AND  DRAINAGE Performed by: Sable Feil Consent: Verbal consent obtained. Risks and benefits: risks, benefits and alternatives were discussed Type: abscess  Body area: left axilla Anesthesia: local infiltration Incision was made with a scalpel. Local anesthetic: lidocaine 1% without epinephrine Anesthetic total:4 ml Complexity: complex Blunt dissection to break up loculations Drainage: purulent Drainage amount: Small Packing material: 1/4 in iodoform gauze Patient tolerance: Patient tolerated the procedure well with no immediate complications. Procedures Critical Care performed: No  ____________________________________________   INITIAL IMPRESSION / ASSESSMENT AND PLAN / ED COURSE  Pertinent labs & imaging results that were available during my care of the patient were reviewed by me and considered in my medical decision making (see chart for details).  Abscess left axillary area. Lesion was I&D in this department. Patient given discharge care instructions. Patient given prescription for Percocet, Bactrim DS, and Diflucan. Patient advised return back in 2 days for wound check.      ____________________________________________   FINAL CLINICAL IMPRESSION(S) / ED DIAGNOSES  Final diagnoses:  Abscess of axilla, left      NEW MEDICATIONS STARTED DURING THIS VISIT:  New Prescriptions   FLUCONAZOLE (DIFLUCAN) 150 MG TABLET    Take 1 tablet (150 mg total) by mouth daily.   OXYCODONE-ACETAMINOPHEN (PERCOCET) 7.5-325 MG TABLET    Take 1 tablet by mouth every  6 (six) hours as needed for severe pain.   SULFAMETHOXAZOLE-TRIMETHOPRIM (BACTRIM DS,SEPTRA DS) 800-160 MG TABLET    Take 1 tablet by mouth 2 (two) times daily.     Note:  This document was prepared using Dragon voice recognition software and may include unintentional dictation errors.    Sable Feil, PA-C 03/20/16 1045    Eula Listen, MD 03/20/16 1536

## 2016-03-20 NOTE — ED Triage Notes (Signed)
Pt.denies any other symptoms 

## 2016-03-20 NOTE — ED Triage Notes (Signed)
Pt complains of recurrent cyst under left arm

## 2016-03-22 ENCOUNTER — Encounter: Payer: Self-pay | Admitting: General Surgery

## 2016-03-22 ENCOUNTER — Ambulatory Visit (INDEPENDENT_AMBULATORY_CARE_PROVIDER_SITE_OTHER): Payer: 59 | Admitting: General Surgery

## 2016-03-22 VITALS — BP 124/80 | HR 70 | Resp 14 | Ht 68.0 in | Wt 224.0 lb

## 2016-03-22 DIAGNOSIS — L732 Hidradenitis suppurativa: Secondary | ICD-10-CM | POA: Diagnosis not present

## 2016-03-22 LAB — AEROBIC CULTURE  (SUPERFICIAL SPECIMEN)

## 2016-03-22 LAB — AEROBIC CULTURE W GRAM STAIN (SUPERFICIAL SPECIMEN)
Culture: NO GROWTH
Special Requests: NORMAL

## 2016-03-22 NOTE — Patient Instructions (Signed)
Use heat to the area. Call with any problems or to schedule surgery.

## 2016-03-22 NOTE — Progress Notes (Signed)
Patient ID: Carla Payne, female   DOB: [birth year], 31 y.o.   MRN: [id number]  Chief Complaint  Patient presents with  . Follow-up    hidradenitis    HPI Carla Payne is a 31 y.o. female here for reevaluation of hidradenitis in the left axillary region. She states that the area is still painful and had drainage. She developed an abscess and was seen in the ER on 03/20/16 and given antibiotics. She reports that she has other areas in the left axilla that have been draining for a while.        HPI  Past Medical History:  Diagnosis Date  . Anxiety   . Diabetes mellitus without complication ([place]) 9983  . Hidradenitis 2014  . Hyperlipidemia   . Pilonidal cyst 2012    Past Surgical History:  Procedure Laterality Date  . AXILLARY HIDRADENITIS EXCISION  01-05-13  . axillary mass Left 2014 ,may  . HYDRADENITIS EXCISION Bilateral 10/12/2014   Procedure: EXCISION HIDRADENITIS GROIN;  Surgeon: Robert Bellow, MD;  Location: [facility] ORS;  Service: General;  Laterality: Bilateral;  . PERINEAL HIDRADENITIS EXCISION      Family History  Problem Relation Age of Onset  . Hypertension Father     Social History Social History  Substance Use Topics  . Smoking status: Current Every Day Smoker    Packs/day: 0.50    Years: 13.00    Types: Cigarettes  . Smokeless tobacco: Never Used  . Alcohol use Yes     Comment: wine on weekends    No Known Allergies  Current Outpatient Prescriptions  Medication Sig Dispense Refill  . etonogestrel (NEXPLANON) 68 MG IMPL implant 1 each by Subdermal route once.    . fluconazole (DIFLUCAN) 150 MG tablet Take 1 tablet (150 mg total) by mouth daily. Take on day 1 and day 7 2 tablet 0  . glipiZIDE (GLUCOTROL XL) 2.5 MG 24 hr tablet Take 2.5 mg by mouth daily with breakfast.    . HUMALOG KWIKPEN 100 UNIT/ML KiwkPen Inject into the skin. As needed when glucose levels are 130 (based on sliding dosing)    . ibuprofen (ADVIL,MOTRIN) 800 MG tablet     .  medroxyPROGESTERone (DEPO-PROVERA) 150 MG/ML injection 1 injection IM every 3 months.    Marland Kitchen oxyCODONE-acetaminophen (PERCOCET) 7.5-325 MG tablet Take 1 tablet by mouth every 6 (six) hours as needed for severe pain. 12 tablet 0  . simvastatin (ZOCOR) 10 MG tablet Take 10 mg by mouth daily at 6 PM.     . SitaGLIPtin-MetFORMIN HCl (JANUMET XR) [phone] MG TB24 Take 1 tablet by mouth daily.    Marland Kitchen sulfamethoxazole-trimethoprim (BACTRIM DS,SEPTRA DS) 800-160 MG tablet Take 1 tablet by mouth 2 (two) times daily. 20 tablet 0  . fluconazole (DIFLUCAN) 150 MG tablet Take 1 tablet (150 mg total) by mouth daily. 1 tablet 0   No current facility-administered medications for this visit.     Review of Systems Review of Systems  Constitutional: Negative.   Respiratory: Negative.   Cardiovascular: Negative.     Blood pressure 124/80, pulse 70, resp. rate 14, height 5\' 8"  (1.727 m), weight 224 lb (101.6 kg).  Physical Exam Physical Exam  Constitutional: She is oriented to person, place, and time. She appears well-developed and well-nourished.  Eyes: Conjunctivae are normal. No scleral icterus.  Neck: Neck supple.  Cardiovascular: Normal rate, regular rhythm and normal heart sounds.   Pulmonary/Chest: Effort normal and breath sounds normal.    Lymphadenopathy:  She has no cervical adenopathy.  Neurological: She is alert and oriented to person, place, and time.  Skin: Skin is warm and dry.  Psychiatric: She has a normal mood and affect.    Data Reviewed Wound culture from 03/20/2016 showed polymorphonuclear and monitored clear cells, rare gram variable rod. No growth at 2 days.  Assessment    Hidradenitis left axilla.    Plan    The patient does not desire surgical excision at this time.  She's been encouraged to make use of local heat and complete the previously prescribed antibiotic therapy.   She'll notify the office if she experiences any new changes or desires to proceed to surgery.  .  This has been scribed by Lesly Rubenstein LPN    Robert Bellow 03/22/2016, 8:14 PM

## 2016-04-12 ENCOUNTER — Other Ambulatory Visit: Payer: Self-pay | Admitting: Internal Medicine

## 2016-04-12 ENCOUNTER — Telehealth: Payer: Self-pay | Admitting: *Deleted

## 2016-04-12 DIAGNOSIS — M79622 Pain in left upper arm: Secondary | ICD-10-CM

## 2016-04-12 NOTE — Telephone Encounter (Signed)
She called to let us know that the area is still draining some. She states she woke up this morning with an aching sensation from under her arm to her chest area. She denies any redness, warmth, fever or chills. She states she is going to take the few antibiotics that she has left and see if that helps. She will monitor the drainage for change in color or consistency. Encouraged to continue to use heat to the area and use ibuprofen for discomfort. She will call or send a my chart message on Monday with an update, she may need an appointment if area does not improve over the weekend.

## 2016-04-18 ENCOUNTER — Telehealth: Payer: Self-pay | Admitting: *Deleted

## 2016-04-18 ENCOUNTER — Ambulatory Visit
Admission: RE | Admit: 2016-04-18 | Discharge: 2016-04-18 | Disposition: A | Discharge status: Home / Self Care | Payer: 59 | Source: Ambulatory Visit | Attending: Internal Medicine | Admitting: Internal Medicine

## 2016-04-18 DIAGNOSIS — M79602 Pain in left arm: Secondary | ICD-10-CM | POA: Diagnosis not present

## 2016-04-18 DIAGNOSIS — M79622 Pain in left upper arm: Secondary | ICD-10-CM

## 2016-04-18 DIAGNOSIS — L02412 Cutaneous abscess of left axilla: Secondary | ICD-10-CM | POA: Diagnosis not present

## 2016-04-18 NOTE — Telephone Encounter (Signed)
She called to let us know that she saw her PCP last week regarding her shoulder and arm pain. Cardiac check was negative. She states her PCP placed her on an antibiotic Bactrim incase it was from the hidradenitis. She states the pain is not as bad. The drainage is unchanged which is chronic. She had an ultrasound done today and wasn't sure if she needed to come in for an appointment?

## 2016-04-19 NOTE — Telephone Encounter (Signed)
Notified patient as instructed, patient to decide. Discussed follow-up appointments to be when she decides to have procedure

## 2016-04-19 NOTE — Telephone Encounter (Signed)
Opening the area may help it heal quicker. This would be in the office with local anesthesia. See if she is interested.

## 2016-04-25 ENCOUNTER — Ambulatory Visit: Payer: 59 | Admitting: General Surgery

## 2016-05-01 ENCOUNTER — Ambulatory Visit: Payer: 59 | Admitting: General Surgery

## 2016-06-04 ENCOUNTER — Telehealth: Payer: Self-pay | Admitting: *Deleted

## 2016-06-04 NOTE — Telephone Encounter (Signed)
Patient called the office wanting to arrange surgery.   Will have Dr. Bary Castilla complete surgery sheet and then get date arranged.   Patient would like to have this completed on 08-24-16 if possible.

## 2016-06-13 NOTE — Telephone Encounter (Signed)
Message for patient to call the office.   We have patient's surgery scheduled for 08-24-16.   Would like to review surgery instructions.   Also, need to make a pre-op appointment.

## 2016-06-13 NOTE — Telephone Encounter (Signed)
Patient called back and was notified of surgery date and instructions. We will review in detail at pre-op appointment that is scheduled for 08-15-16 at 1:15 pm. Patient verbalizes understanding.

## 2016-08-07 ENCOUNTER — Other Ambulatory Visit: Payer: Self-pay | Admitting: General Surgery

## 2016-08-07 DIAGNOSIS — L732 Hidradenitis suppurativa: Secondary | ICD-10-CM

## 2016-08-15 ENCOUNTER — Encounter: Payer: Self-pay | Admitting: General Surgery

## 2016-08-15 ENCOUNTER — Ambulatory Visit (INDEPENDENT_AMBULATORY_CARE_PROVIDER_SITE_OTHER): Payer: 59 | Admitting: General Surgery

## 2016-08-15 VITALS — BP 132/80 | HR 102 | Resp 14 | Ht 69.0 in | Wt 222.0 lb

## 2016-08-15 DIAGNOSIS — L732 Hidradenitis suppurativa: Secondary | ICD-10-CM | POA: Diagnosis not present

## 2016-08-15 MED ORDER — FLUCONAZOLE 150 MG PO TABS
150.0000 mg | ORAL_TABLET | ORAL | 0 refills | Status: DC | PRN
Start: 2016-08-15 — End: 2016-09-25

## 2016-08-15 MED ORDER — SULFAMETHOXAZOLE-TRIMETHOPRIM 800-160 MG PO TABS: 1.0000 | ORAL_TABLET | Freq: Two times a day (BID) | ORAL | 0 refills | Status: DC

## 2016-08-15 NOTE — Progress Notes (Signed)
Patient ID: Carla Payne, female   DOB: 09/18/1985, 31 y.o.   MRN: [id number]  Chief Complaint  Patient presents with  . Pre-op Exam    HPI Carla Payne is a 31 y.o. female here today for pre op left axillary hidradenitis excision scheduled on 08/24/2016. Patient states his morning the area was bleeding a lot.  HPI  Past Medical History:  Diagnosis Date  . Anxiety   . Diabetes mellitus without complication ([place]) 0626  . Hidradenitis 2014  . Hyperlipidemia   . Pilonidal cyst 2012    Past Surgical History:  Procedure Laterality Date  . AXILLARY HIDRADENITIS EXCISION  01-05-13  . axillary mass Left 2014 ,may  . HYDRADENITIS EXCISION Bilateral 10/12/2014   Procedure: EXCISION HIDRADENITIS GROIN;  Surgeon: Robert Bellow, MD;  Location: [facility] ORS;  Service: General;  Laterality: Bilateral;  . PERINEAL HIDRADENITIS EXCISION      Family History  Problem Relation Age of Onset  . Hypertension Father     Social History Social History  Substance Use Topics  . Smoking status: Current Every Day Smoker    Packs/day: 0.50    Years: 13.00    Types: Cigarettes  . Smokeless tobacco: Never Used  . Alcohol use Yes     Comment: wine on weekends    No Known Allergies  Current Outpatient Prescriptions  Medication Sig Dispense Refill  . etonogestrel (NEXPLANON) 68 MG IMPL implant 1 each by Subdermal route once.    . fluconazole (DIFLUCAN) 150 MG tablet Take 1 tablet (150 mg total) by mouth as needed. Take on day 1 and day 7 2 tablet 0  . glipiZIDE (GLUCOTROL XL) 2.5 MG 24 hr tablet Take 2.5 mg by mouth daily with breakfast.    . HUMALOG KWIKPEN 100 UNIT/ML KiwkPen Inject into the skin. As needed when glucose levels are 130 (based on sliding dosing)    . simvastatin (ZOCOR) 10 MG tablet Take 10 mg by mouth daily at 6 PM.     . SitaGLIPtin-MetFORMIN HCl (JANUMET XR) [phone] MG TB24 Take 1 tablet by mouth daily.    Marland Kitchen sulfamethoxazole-trimethoprim (BACTRIM DS,SEPTRA DS) 800-160 MG  tablet Take 1 tablet by mouth 2 (two) times daily. 28 tablet 0  . acetaminophen (TYLENOL) 500 MG tablet Take 2,000 mg by mouth 2 (two) times daily.     No current facility-administered medications for this visit.     Review of Systems Review of Systems  Constitutional: Negative.   Respiratory: Negative.   Cardiovascular: Negative.     Blood pressure 132/80, pulse (!) 102, resp. rate 14, height 5\' 9"  (1.753 m), weight 222 lb (100.7 kg).  Physical Exam Physical Exam  Constitutional: She is oriented to person, place, and time. She appears well-developed and well-nourished.  Cardiovascular: Normal rate, regular rhythm and normal heart sounds.   Pulmonary/Chest: Effort normal and breath sounds normal.    Lymphadenopathy:  7 by 6 area of hidradenitis left axillary.  Neurological: She is alert and oriented to person, place, and time.  Skin: Skin is warm and dry.    Data Reviewed 03/20/2016 abscess culture from the ED: No growth.  Assessment    Hidradenitis of the left axilla with multiple pustules.    Plan    In light of planned surgery for excision, the patient will he placed on Bactrim DS, 1 by mouth twice a day to quiet the inflammatory process.  She's been encouraged to make use of probiotics or yogurt on a daily basis to  minimize vaginitis which she is experienced frequently in the past.  Diflucan has been sent to the pharmacy should she develop symptoms in spite of preventive measures.  She's familiar with the surgical plans based on treatment of the right axilla.      Patient is scheduled for surgery on 08/24/2016. Diflucan and Bactrim sent to Pharmacy.    HPI, Physical Exam, Assessment and Plan have been scribed under the direction and in the presence of Hervey Ard, MD.  Gaspar Cola, CMA  I have completed the exam and reviewed the above documentation for accuracy and completeness.  I agree with the above.  Haematologist has been used and any errors  in dictation or transcription are unintentional.  Hervey Ard, M.D., F.A.C.S.  Robert Bellow 08/15/2016, 8:51 PM

## 2016-08-15 NOTE — Patient Instructions (Signed)
Patient is scheduled for surgery on 08/24/2016. Diflucan and Bactrim sent to Pharmacy.

## 2016-08-16 ENCOUNTER — Encounter
Admission: RE | Admit: 2016-08-16 | Discharge: 2016-08-16 | Disposition: A | Discharge status: Home / Self Care | Payer: 59 | Source: Ambulatory Visit | Attending: General Surgery | Admitting: General Surgery

## 2016-08-16 DIAGNOSIS — E119 Type 2 diabetes mellitus without complications: Secondary | ICD-10-CM | POA: Diagnosis not present

## 2016-08-16 DIAGNOSIS — Z01818 Encounter for other preprocedural examination: Secondary | ICD-10-CM | POA: Insufficient documentation

## 2016-08-16 LAB — BASIC METABOLIC PANEL
ANION GAP: 8 (ref 5–15)
BUN: 10 mg/dL (ref 6–20)
CHLORIDE: 102 mmol/L (ref 101–111)
CO2: 26 mmol/L (ref 22–32)
Calcium: 9.5 mg/dL (ref 8.9–10.3)
Creatinine, Ser: 0.54 mg/dL (ref 0.44–1.00)
GFR calc Af Amer: >60 mL/min (ref >60)
GFR calc non Af Amer: >60 mL/min (ref >60)
Glucose, Bld: 160 mg/dL — ABNORMAL HIGH (ref 65–99)
POTASSIUM: 3.9 mmol/L (ref 3.5–5.1)
SODIUM: 136 mmol/L (ref 135–145)

## 2016-08-16 NOTE — Patient Instructions (Signed)
  Your procedure is scheduled on: 08/24/16 Report to Day Surgery. [facility]OND [facility]LO[facility] To find out your arrival time please call [phone] between 1PM - 3PM on  08/23/16  Remember: Instructions that are not followed completely may result in serious medical risk, up to and including death, or upon the discretion of your surgeon and anesthesiologist your surgery may need to be rescheduled.    _X___ 1. Do not eat food or drink liquids after midnight. No gum chewing or hard candies.     _X___ 2. No Alcohol for 24 hours before or after surgery.                 X__ 3. Do Not Smoke For 24 Hours Prior to Your Surgery.   ____ 4. Bring all medications with you on the day of surgery if instructed.    __X__ 5. Notify your doctor if there is any change in your medical condition     (cold, fever, infections).       Do not wear jewelry, make-up, hairpins, clips or nail polish.  Do not wear lotions, powders, or perfumes. You may wear deodorant.  Do not shave 48 hours prior to surgery. Men may shave face and neck.  Do not bring valuables to the hospital.    [facility] is not responsible for any belongings or valuables.               Contacts, dentures or bridgework may not be worn into surgery.  Leave your suitcase in the car. After surgery it may be brought to your room.  For patients admitted to the hospital, discharge time is determined by your                treatment team.   Patients discharged the day of surgery will not be allowed to drive home.   Please read over the following fact sheets that you were given:   Surgical Site Infection Prevention   ____ Take these medicines the morning of surgery with A SIP OF WATER:    1. NONE  2.   3.   4.  5.  6.  ____ Fleet Enema (as directed)   _X___ Use CHG Soap as directed  ____ Use inhalers on the day of surgery  __X__ Stop metformin 2 days prior to surgery  STOP JANUMET 2 DAYS BEFORE SURGERY  __X__ Take 1/2 of usual insulin  dose the night before surgery and none on the morning of surgery.   ____ Stop Coumadin/Plavix/aspirin on ____ Stop Anti-inflammatories on   ____ Stop supplements until after surgery.    ____ Bring C-Pap to the hospital.

## 2016-08-23 MED ORDER — CEFAZOLIN SODIUM-DEXTROSE 2-4 GM/100ML-% IV SOLN
2.0000 g | INTRAVENOUS | Status: AC
  Administered 2016-08-24: 2 g via INTRAVENOUS

## 2016-08-24 ENCOUNTER — Encounter
Admission: RE | Disposition: A | Discharge status: Home / Self Care | Payer: Self-pay | Source: Ambulatory Visit | Attending: General Surgery

## 2016-08-24 ENCOUNTER — Ambulatory Visit
Admission: RE | Admit: 2016-08-24 | Discharge: 2016-08-24 | Disposition: A | Discharge status: Home / Self Care | Payer: 59 | Source: Ambulatory Visit | Attending: General Surgery | Admitting: General Surgery

## 2016-08-24 ENCOUNTER — Ambulatory Visit: Payer: 59 | Admitting: Anesthesiology

## 2016-08-24 DIAGNOSIS — Z8249 Family history of ischemic heart disease and other diseases of the circulatory system: Secondary | ICD-10-CM | POA: Diagnosis not present

## 2016-08-24 DIAGNOSIS — E785 Hyperlipidemia, unspecified: Secondary | ICD-10-CM | POA: Insufficient documentation

## 2016-08-24 DIAGNOSIS — F17210 Nicotine dependence, cigarettes, uncomplicated: Secondary | ICD-10-CM | POA: Insufficient documentation

## 2016-08-24 DIAGNOSIS — Z7984 Long term (current) use of oral hypoglycemic drugs: Secondary | ICD-10-CM | POA: Diagnosis not present

## 2016-08-24 DIAGNOSIS — L02412 Cutaneous abscess of left axilla: Secondary | ICD-10-CM

## 2016-08-24 DIAGNOSIS — Z793 Long term (current) use of hormonal contraceptives: Secondary | ICD-10-CM | POA: Insufficient documentation

## 2016-08-24 DIAGNOSIS — L732 Hidradenitis suppurativa: Secondary | ICD-10-CM | POA: Diagnosis present

## 2016-08-24 DIAGNOSIS — Z9889 Other specified postprocedural states: Secondary | ICD-10-CM | POA: Insufficient documentation

## 2016-08-24 DIAGNOSIS — E119 Type 2 diabetes mellitus without complications: Secondary | ICD-10-CM | POA: Diagnosis not present

## 2016-08-24 DIAGNOSIS — Z79899 Other long term (current) drug therapy: Secondary | ICD-10-CM | POA: Insufficient documentation

## 2016-08-24 HISTORY — PX: HYDRADENITIS EXCISION: SHX5243

## 2016-08-24 LAB — GLUCOSE, CAPILLARY
Glucose-Capillary: 189 mg/dL — ABNORMAL HIGH (ref 65–99)
Glucose-Capillary: 211 mg/dL — ABNORMAL HIGH (ref 65–99)

## 2016-08-24 SURGERY — EXCISION, HIDRADENITIS, AXILLA
Anesthesia: General | Laterality: Left | Wound class: Dirty or Infected

## 2016-08-24 MED ORDER — ACETAMINOPHEN 10 MG/ML IV SOLN
INTRAVENOUS | Status: AC
  Filled 2016-08-24: qty 100

## 2016-08-24 MED ORDER — PROPOFOL 10 MG/ML IV BOLUS
INTRAVENOUS | Status: DC | PRN
  Administered 2016-08-24: 200 mg via INTRAVENOUS

## 2016-08-24 MED ORDER — FENTANYL CITRATE (PF) 100 MCG/2ML IJ SOLN
INTRAMUSCULAR | Status: AC
  Filled 2016-08-24: qty 2

## 2016-08-24 MED ORDER — OXYCODONE HCL 5 MG PO TABS
5.0000 mg | ORAL_TABLET | Freq: Once | ORAL | Status: AC | PRN
  Administered 2016-08-24: 5 mg via ORAL

## 2016-08-24 MED ORDER — MIDAZOLAM HCL 2 MG/2ML IJ SOLN
INTRAMUSCULAR | Status: AC
  Filled 2016-08-24: qty 2

## 2016-08-24 MED ORDER — FAMOTIDINE 20 MG PO TABS
20.0000 mg | ORAL_TABLET | Freq: Once | ORAL | Status: AC
  Administered 2016-08-24: 20 mg via ORAL

## 2016-08-24 MED ORDER — PHENYLEPHRINE HCL 10 MG/ML IJ SOLN
INTRAMUSCULAR | Status: DC | PRN
  Administered 2016-08-24 (×4): 100 ug via INTRAVENOUS
  Administered 2016-08-24: 200 ug via INTRAVENOUS
  Administered 2016-08-24: 100 ug via INTRAVENOUS

## 2016-08-24 MED ORDER — ONDANSETRON HCL 4 MG/2ML IJ SOLN
INTRAMUSCULAR | Status: AC
  Filled 2016-08-24: qty 2

## 2016-08-24 MED ORDER — FENTANYL CITRATE (PF) 100 MCG/2ML IJ SOLN
25.0000 ug | INTRAMUSCULAR | Status: DC | PRN
  Administered 2016-08-24 (×3): 50 ug via INTRAVENOUS

## 2016-08-24 MED ORDER — PROPOFOL 10 MG/ML IV BOLUS
INTRAVENOUS | Status: AC
  Filled 2016-08-24: qty 40

## 2016-08-24 MED ORDER — OXYCODONE HCL 5 MG/5ML PO SOLN: 5.0000 mg | Freq: Once | ORAL | Status: AC | PRN

## 2016-08-24 MED ORDER — CEFAZOLIN SODIUM-DEXTROSE 2-4 GM/100ML-% IV SOLN
INTRAVENOUS | Status: AC
  Filled 2016-08-24: qty 100

## 2016-08-24 MED ORDER — FAMOTIDINE 20 MG PO TABS
ORAL_TABLET | ORAL | Status: DC
Start: 2016-08-24 — End: 2016-08-24
  Filled 2016-08-24: qty 1

## 2016-08-24 MED ORDER — PROMETHAZINE HCL 25 MG/ML IJ SOLN: 6.2500 mg | INTRAMUSCULAR | Status: DC | PRN

## 2016-08-24 MED ORDER — MIDAZOLAM HCL 2 MG/2ML IJ SOLN
INTRAMUSCULAR | Status: DC | PRN
  Administered 2016-08-24: 2 mg via INTRAVENOUS

## 2016-08-24 MED ORDER — OXYCODONE HCL 5 MG PO TABS
ORAL_TABLET | ORAL | Status: AC
  Filled 2016-08-24: qty 1

## 2016-08-24 MED ORDER — DEXAMETHASONE SODIUM PHOSPHATE 10 MG/ML IJ SOLN
INTRAMUSCULAR | Status: DC | PRN
  Administered 2016-08-24: 10 mg via INTRAVENOUS

## 2016-08-24 MED ORDER — GLYCOPYRROLATE 0.2 MG/ML IJ SOLN
INTRAMUSCULAR | Status: DC | PRN
  Administered 2016-08-24: 0.2 mg via INTRAVENOUS

## 2016-08-24 MED ORDER — SODIUM CHLORIDE 0.9 % IV SOLN
INTRAVENOUS | Status: DC
  Administered 2016-08-24: 07:00:00 via INTRAVENOUS

## 2016-08-24 MED ORDER — PHENYLEPHRINE HCL 10 MG/ML IJ SOLN
INTRAMUSCULAR | Status: AC
  Filled 2016-08-24: qty 1

## 2016-08-24 MED ORDER — HYDROCODONE-ACETAMINOPHEN 7.5-325 MG PO TABS: 1.0000 | ORAL_TABLET | ORAL | 0 refills | Status: DC | PRN

## 2016-08-24 MED ORDER — BUPIVACAINE HCL 0.5 % IJ SOLN
INTRAMUSCULAR | Status: DC | PRN
  Administered 2016-08-24: 30 mL

## 2016-08-24 MED ORDER — SUCCINYLCHOLINE CHLORIDE 20 MG/ML IJ SOLN
INTRAMUSCULAR | Status: AC
  Filled 2016-08-24: qty 1

## 2016-08-24 MED ORDER — ESMOLOL HCL 100 MG/10ML IV SOLN
INTRAVENOUS | Status: DC | PRN
  Administered 2016-08-24: 20 mg via INTRAVENOUS

## 2016-08-24 MED ORDER — BUPIVACAINE HCL (PF) 0.5 % IJ SOLN
INTRAMUSCULAR | Status: AC
  Filled 2016-08-24: qty 30

## 2016-08-24 MED ORDER — KETOROLAC TROMETHAMINE 30 MG/ML IJ SOLN
INTRAMUSCULAR | Status: DC | PRN
  Administered 2016-08-24: 30 mg via INTRAVENOUS

## 2016-08-24 MED ORDER — KETOROLAC TROMETHAMINE 30 MG/ML IJ SOLN
INTRAMUSCULAR | Status: AC
  Filled 2016-08-24: qty 1

## 2016-08-24 MED ORDER — ESMOLOL HCL 100 MG/10ML IV SOLN
INTRAVENOUS | Status: AC
  Filled 2016-08-24: qty 10

## 2016-08-24 MED ORDER — NAPROXEN SODIUM 220 MG PO TABS: 440.0000 mg | ORAL_TABLET | Freq: Two times a day (BID) | ORAL | 0 refills | Status: DC

## 2016-08-24 MED ORDER — ACETAMINOPHEN 10 MG/ML IV SOLN
INTRAVENOUS | Status: DC | PRN
  Administered 2016-08-24: 1000 mg via INTRAVENOUS

## 2016-08-24 MED ORDER — LIDOCAINE HCL (CARDIAC) 20 MG/ML IV SOLN
INTRAVENOUS | Status: DC | PRN
  Administered 2016-08-24: 100 mg via INTRAVENOUS

## 2016-08-24 MED ORDER — DEXAMETHASONE SODIUM PHOSPHATE 10 MG/ML IJ SOLN
INTRAMUSCULAR | Status: AC
  Filled 2016-08-24: qty 1

## 2016-08-24 MED ORDER — MEPERIDINE HCL 50 MG/ML IJ SOLN: 6.2500 mg | INTRAMUSCULAR | Status: DC | PRN

## 2016-08-24 MED ORDER — FENTANYL CITRATE (PF) 100 MCG/2ML IJ SOLN
INTRAMUSCULAR | Status: DC | PRN
  Administered 2016-08-24 (×2): 50 ug via INTRAVENOUS
  Administered 2016-08-24 (×4): 25 ug via INTRAVENOUS

## 2016-08-24 MED ORDER — EPHEDRINE SULFATE 50 MG/ML IJ SOLN
INTRAMUSCULAR | Status: AC
  Filled 2016-08-24: qty 1

## 2016-08-24 MED ORDER — ONDANSETRON HCL 4 MG/2ML IJ SOLN
INTRAMUSCULAR | Status: DC | PRN
  Administered 2016-08-24: 4 mg via INTRAVENOUS

## 2016-08-24 MED ORDER — GLYCOPYRROLATE 0.2 MG/ML IJ SOLN
INTRAMUSCULAR | Status: AC
  Filled 2016-08-24: qty 2

## 2016-08-24 MED ORDER — LIDOCAINE HCL (PF) 2 % IJ SOLN
INTRAMUSCULAR | Status: AC
  Filled 2016-08-24: qty 2

## 2016-08-24 SURGICAL SUPPLY — 32 items
BLADE CLIPPER SURG (BLADE) ×2 IMPLANT
BULB RESERV EVAC DRAIN JP 100C (MISCELLANEOUS) ×2 IMPLANT
CANISTER SUCT 1200ML W/VALVE (MISCELLANEOUS) ×3 IMPLANT
CHLORAPREP W/TINT 26ML (MISCELLANEOUS) ×3 IMPLANT
CLOSURE WOUND 1/2 X4 (GAUZE/BANDAGES/DRESSINGS) ×1
DRAIN CHANNEL JP 15F RND 16 (MISCELLANEOUS) ×2 IMPLANT
DRAPE LAPAROTOMY TRNSV 106X77 (MISCELLANEOUS) ×3 IMPLANT
DRSG TEGADERM 4X4.75 (GAUZE/BANDAGES/DRESSINGS) ×3 IMPLANT
DRSG TELFA 4X3 1S NADH ST (GAUZE/BANDAGES/DRESSINGS) ×3 IMPLANT
ELECT REM PT RETURN 9FT ADLT (ELECTROSURGICAL) ×3
ELECTRODE REM PT RTRN 9FT ADLT (ELECTROSURGICAL) ×1 IMPLANT
GLOVE BIO SURGEON STRL SZ7.5 (GLOVE) ×3 IMPLANT
GLOVE INDICATOR 8.0 STRL GRN (GLOVE) ×3 IMPLANT
GOWN STRL REUS W/ TWL LRG LVL3 (GOWN DISPOSABLE) ×2 IMPLANT
GOWN STRL REUS W/TWL LRG LVL3 (GOWN DISPOSABLE) ×6
KIT RM TURNOVER STRD PROC AR (KITS) ×3 IMPLANT
LABEL OR SOLS (LABEL) ×3 IMPLANT
NDL HYPO 25X1 1.5 SAFETY (NEEDLE) ×1 IMPLANT
NEEDLE HYPO 25X1 1.5 SAFETY (NEEDLE) ×3 IMPLANT
NS IRRIG 500ML POUR BTL (IV SOLUTION) ×3 IMPLANT
PACK BASIN MINOR ARMC (MISCELLANEOUS) ×3 IMPLANT
SHEARS HARMONIC 9CM CVD (BLADE) IMPLANT
STAPLER SKIN PROX 35W (STAPLE) ×2 IMPLANT
STRIP CLOSURE SKIN 1/2X4 (GAUZE/BANDAGES/DRESSINGS) ×2 IMPLANT
SUT VIC AB 2-0 CT1 27 (SUTURE) ×6
SUT VIC AB 2-0 CT1 TAPERPNT 27 (SUTURE) ×1 IMPLANT
SUT VIC AB 3-0 54X BRD REEL (SUTURE) ×1 IMPLANT
SUT VIC AB 3-0 BRD 54 (SUTURE) ×3
SUT VIC AB 4-0 PS2 18 (SUTURE) ×3 IMPLANT
SWAB DUAL CULTURE TRANS RED ST (MISCELLANEOUS) ×2 IMPLANT
SWABSTK COMLB BENZOIN TINCTURE (MISCELLANEOUS) ×3 IMPLANT
SYR CONTROL 10ML (SYRINGE) ×3 IMPLANT

## 2016-08-24 NOTE — Transfer of Care (Signed)
Immediate Anesthesia Transfer of Care Note  Patient: Carla Payne  Procedure(s) Performed: Procedure(s): EXCISION HIDRADENITIS AXILLA (Left)  Patient Location: PACU  Anesthesia Type:General  Level of Consciousness: sedated  Airway & Oxygen Therapy: Patient Spontanous Breathing and Patient connected to face mask oxygen  Post-op Assessment: Report given to RN and Post -op Vital signs reviewed and stable  Post vital signs: Reviewed and stable  Last Vitals:  Vitals:   08/24/16 0832 08/24/16 0833  BP: 119/67   Pulse: (!) 125   Resp: (!) 28   Temp: 36.8 C (P) 36.8 C  SpO2: 885%     Complications: No apparent anesthesia complications

## 2016-08-24 NOTE — Discharge Instructions (Signed)
  AMBULATORY SURGERY  DISCHARGE INSTRUCTIONS   1) The drugs that you were given will stay in your system until tomorrow so for the next 24 hours you should not:  A) Drive an automobile B) Make any legal decisions C) Drink any alcoholic beverage   2) You may resume regular meals tomorrow.  Today it is better to start with liquids and gradually work up to solid foods.  You may eat anything you prefer, but it is better to start with liquids, then soup and crackers, and gradually work up to solid foods.   3) Please notify your doctor immediately if you have any unusual bleeding, trouble breathing, redness and pain at the surgery site, drainage, fever, or pain not relieved by medication.    4) Additional Instructions: TAKE A STOOL SOFTENER TWICE A DAY WHILE TAKING NARCOTIC PAIN MEDICINE TO PREVENT CONSTIPATION   Please contact your physician with any problems or Same Day Surgery at [phone], Monday through Friday 6 am to 4 pm, or [place] [facility] [place] Main number at [phone].   

## 2016-08-24 NOTE — Anesthesia Preprocedure Evaluation (Signed)
Anesthesia Evaluation  Patient identified by MRN, date of birth, ID band Patient awake    Reviewed: Allergy & Precautions, NPO status , Patient's Chart, lab work & pertinent test results  History of Anesthesia Complications Negative for: history of anesthetic complications  Airway Mallampati: III  TM Distance: >3 FB Neck ROM: Full    Dental no notable dental hx.    Pulmonary neg sleep apnea, neg COPD, Current Smoker,    breath sounds clear to auscultation- rhonchi (-) wheezing      Cardiovascular Exercise Tolerance: Good (-) hypertension(-) CAD, (-) Past MI and (-) Cardiac Stents  Rhythm:Regular Rate:Normal - Systolic murmurs and - Diastolic murmurs    Neuro/Psych Anxiety negative neurological ROS     GI/Hepatic negative GI ROS, Neg liver ROS,   Endo/Other  diabetes, Oral Hypoglycemic Agents  Renal/GU negative Renal ROS     Musculoskeletal negative musculoskeletal ROS (+)   Abdominal (+) + obese,   Peds  Hematology negative hematology ROS (+)   Anesthesia Other Findings Past Medical History: No date: Anxiety 2014: Diabetes mellitus without complication ([place]) 0712: Hidradenitis No date: Hyperlipidemia 2012: Pilonidal cyst   Reproductive/Obstetrics                             Anesthesia Physical Anesthesia Plan  ASA: II  Anesthesia Plan: General   Post-op Pain Management:    Induction: Intravenous  PONV Risk Score and Plan: 1 and Ondansetron and Dexamethasone  Airway Management Planned: LMA  Additional Equipment:   Intra-op Plan:   Post-operative Plan:   Informed Consent: I have reviewed the patients History and Physical, chart, labs and discussed the procedure including the risks, benefits and alternatives for the proposed anesthesia with the patient or authorized representative who has indicated his/her understanding and acceptance.   Dental advisory given  Plan  Discussed with: CRNA and Anesthesiologist  Anesthesia Plan Comments:         Anesthesia Quick Evaluation

## 2016-08-24 NOTE — Anesthesia Postprocedure Evaluation (Signed)
Anesthesia Post Note  Patient: Carla Payne  Procedure(s) Performed: Procedure(s) (LRB): EXCISION HIDRADENITIS AXILLA (Left)  Patient location during evaluation: PACU Anesthesia Type: General Level of consciousness: awake and alert and oriented Pain management: pain level controlled Vital Signs Assessment: post-procedure vital signs reviewed and stable Respiratory status: spontaneous breathing, nonlabored ventilation and respiratory function stable Cardiovascular status: blood pressure returned to baseline and stable Postop Assessment: no signs of nausea or vomiting Anesthetic complications: no     Last Vitals:  Vitals:   08/24/16 0915 08/24/16 0917  BP:  121/78  Pulse:  (!) 120  Resp:  13  Temp:    SpO2: 95% 97%    Last Pain:  Vitals:   08/24/16 0920  TempSrc:   PainSc: 2                  Sarye Kath

## 2016-08-24 NOTE — OR Nursing (Signed)
Patient unable to void for urine pregnancy test this am.  Patient started period yesterday and I visualized moderate amount of vaginal bleeding.  Dr Randa Lynn aware,  urine pregnancy canceled per Dr Randa Lynn.

## 2016-08-24 NOTE — Op Note (Signed)
Preoperative diagnosis: Hidradenitis left axilla.  Postoperative diagnosis: Same.  Operative procedure: Excision chronic skin infection of the left axilla with primary closure.  Operating surgeon: Hervey Ard, M.D.  Anesthesia: Gen. By LMA, Marcaine 0.5% plain, 30 cc.  Estimated blood loss: 15 cc.  Clinical note: This 31 year old woman has long-standing hidradenitis of the left axilla that still respond to conservative measures. She has previously had the right axillary process excised with good results. Spot preoperative planned excision. She received prior procedure. Prior cultures had not shown any specific organism.  Operative note: With the patient under adequate general anesthesia and a bolster behind the left shoulder and the left arm supported and extended the axilla was prepped with Betadine scrub and solution and draped. As opposed to the right side the inflammatory process extends longitudinally in the axilla, 5 centimeters in width and about 8 m in length. It was elected make use of a vertically oriented elliptical incision to the axilla. The area was infiltrated with Marcaine for postoperative analgesia. The skin was incised sharply and the remaining dissection completed with electrocautery. This primarily consisted of the dermis and a small amount subcutaneous fat. In the central portion of the axilla the prosthesis extended slightly deeper and all the chronically inflamed tissue was removed. No hidden abscesses identified. A culture 1 of the pustules was obtained at the beginning of the case. The final size of the defect was 5 x 10 cm.  After assuring good hemostasis with electrocautery and 3-0 Vicryl ties the skin flaps are elevated, about a centimeter in thickness for a 2-3 cm circumferentially. The deep adipose tissue was then anterrupted 2-0 Vicryl figure-of-eight sutures. A second layer similar sutures was used to approximate the subcutaneous fat and then the dermis was  approximated with interrupted 3-0 Vicryl subcutaneous sutures. A Blake drain was placed in the depth of the wound, brought out through a separate stab wound incision and placed to self suction. The skin was approximated with staples.   The wound closure when well with good approximation of the skin. A honeycomb dressing was placed over the primary wound and drain site and the patient taken to recovery room in stable condition.

## 2016-08-24 NOTE — Anesthesia Procedure Notes (Signed)
Procedure Name: LMA Insertion Date/Time: 08/24/2016 7:29 AM Performed by: Doreen Salvage Pre-anesthesia Checklist: Patient identified, Patient being monitored, Timeout performed, Emergency Drugs available and Suction available Patient Re-evaluated:Patient Re-evaluated prior to induction Oxygen Delivery Method: Circle system utilized Preoxygenation: Pre-oxygenation with 100% oxygen Induction Type: IV induction Ventilation: Mask ventilation without difficulty LMA: LMA inserted LMA Size: 3.5 Tube type: Oral Number of attempts: 1 Placement Confirmation: positive ETCO2 and breath sounds checked- equal and bilateral Tube secured with: Tape Dental Injury: Teeth and Oropharynx as per pre-operative assessment

## 2016-08-24 NOTE — Anesthesia Post-op Follow-up Note (Signed)
Anesthesia QCDR form completed.        

## 2016-08-24 NOTE — H&P (Addendum)
Lungs: Clear. Cardio: RR.  No change in plans for left axillary hidradenitis. Lower abdominal discomfort with change in birth control.

## 2016-08-28 LAB — SURGICAL PATHOLOGY

## 2016-08-29 ENCOUNTER — Encounter: Payer: Self-pay | Admitting: General Surgery

## 2016-08-29 ENCOUNTER — Other Ambulatory Visit: Payer: Self-pay | Admitting: *Deleted

## 2016-08-29 ENCOUNTER — Ambulatory Visit (INDEPENDENT_AMBULATORY_CARE_PROVIDER_SITE_OTHER): Payer: 59 | Admitting: General Surgery

## 2016-08-29 VITALS — BP 124/70 | HR 119 | Resp 14 | Ht 69.0 in | Wt 219.0 lb

## 2016-08-29 DIAGNOSIS — M791 Myalgia, unspecified site: Secondary | ICD-10-CM

## 2016-08-29 DIAGNOSIS — M2550 Pain in unspecified joint: Secondary | ICD-10-CM

## 2016-08-29 DIAGNOSIS — L732 Hidradenitis suppurativa: Secondary | ICD-10-CM

## 2016-08-29 LAB — AEROBIC/ANAEROBIC CULTURE W GRAM STAIN (SURGICAL/DEEP WOUND): Culture: NORMAL

## 2016-08-29 LAB — AEROBIC/ANAEROBIC CULTURE (SURGICAL/DEEP WOUND)

## 2016-08-29 NOTE — Progress Notes (Addendum)
Patient ID: Carla Payne, female   DOB: 05-Jun-1985, 30 y.o.   MRN: [id number]  Chief Complaint  Patient presents with  . Routine Post Op    HPI Carla Payne is a 31 y.o. female.  Here today for postoperative visit, left hydradenitis excision on 08-24-16. She states she is doing well. Drain sheet present. She is here with her significant other, Carla Payne.  HPI  Past Medical History:  Diagnosis Date  . Anxiety   . Diabetes mellitus without complication ([place]) 5462  . Hidradenitis 2014  . Hyperlipidemia   . Pilonidal cyst 2012    Past Surgical History:  Procedure Laterality Date  . AXILLARY HIDRADENITIS EXCISION  01-05-13  . axillary mass Left 2014 ,may  . HYDRADENITIS EXCISION Bilateral 10/12/2014   Procedure: EXCISION HIDRADENITIS GROIN;  Surgeon: Robert Bellow, MD;  Location: [facility] ORS;  Service: General;  Laterality: Bilateral;  . HYDRADENITIS EXCISION Left 08/24/2016   Procedure: EXCISION HIDRADENITIS AXILLA;  Surgeon: Robert Bellow, MD;  Location: [facility] ORS;  Service: General;  Laterality: Left;  . PERINEAL HIDRADENITIS EXCISION      Family History  Problem Relation Age of Onset  . Hypertension Father     Social History Social History  Substance Use Topics  . Smoking status: Current Every Day Smoker    Packs/day: 0.50    Years: 13.00    Types: Cigarettes  . Smokeless tobacco: Never Used  . Alcohol use Yes     Comment: wine on weekends    No Known Allergies  Current Outpatient Prescriptions  Medication Sig Dispense Refill  . acetaminophen (TYLENOL) 500 MG tablet Take 2,000 mg by mouth 2 (two) times daily.    Marland Kitchen etonogestrel (NEXPLANON) 68 MG IMPL implant 1 each by Subdermal route once.    . fluconazole (DIFLUCAN) 150 MG tablet Take 1 tablet (150 mg total) by mouth as needed. Take on day 1 and day 7 2 tablet 0  . glipiZIDE (GLUCOTROL XL) 2.5 MG 24 hr tablet Take 2.5 mg by mouth daily with breakfast.    . HUMALOG KWIKPEN 100 UNIT/ML KiwkPen Inject  into the skin. As needed when glucose levels are 130 (based on sliding dosing)    . HYDROcodone-acetaminophen (NORCO) 7.5-325 MG tablet Take 1 tablet by mouth every 4 (four) hours as needed for moderate pain. 30 tablet 0  . naproxen sodium (ALEVE) 220 MG tablet Take 2 tablets (440 mg total) by mouth 2 (two) times daily with a meal. 60 tablet 0  . simvastatin (ZOCOR) 10 MG tablet Take 10 mg by mouth daily at 6 PM.     . SitaGLIPtin-MetFORMIN HCl (JANUMET XR) [phone] MG TB24 Take 1 tablet by mouth daily.    Marland Kitchen sulfamethoxazole-trimethoprim (BACTRIM DS,SEPTRA DS) 800-160 MG tablet Take 1 tablet by mouth 2 (two) times daily. 28 tablet 0   No current facility-administered medications for this visit.     Review of Systems Review of Systems  Constitutional: Negative.   Respiratory: Negative.   Cardiovascular: Negative.     Blood pressure 124/70, pulse (!) 119, resp. rate 14, height 5\' 9"  (1.753 m), weight 219 lb (99.3 kg), last menstrual period 08/23/2016.  Physical Exam Physical Exam  Constitutional: She is oriented to person, place, and time. She appears well-developed and well-nourished.  Pulmonary/Chest:    Neurological: She is alert and oriented to person, place, and time.  Skin: Skin is warm and dry.  Psychiatric: Her behavior is normal.    Data Reviewed Pathology showed  evidence of hidradenitis. No malignancy.  Drain record she shows approximately 7.5 mL per day for the last several days.   Assessment    Doing well status post excision of inflamed axillary tissue, left.    Plan    Drain removed without incident. Telfa and Tegaderm applied to drain site. Will leave staples in place at this time.    Follow up in one week.   HPI, Physical Exam, Assessment and Plan have been scribed under the direction and in the presence of Robert Bellow, MD. Karie Fetch, RN  I have completed the exam and reviewed the above documentation for accuracy and completeness.  I agree  with the above.  Haematologist has been used and any errors in dictation or transcription are unintentional.  Hervey Ard, M.D., F.A.C.S.  Robert Bellow 08/29/2016, 9:27 AM

## 2016-08-29 NOTE — Patient Instructions (Signed)
The patient is aware to call back for any questions or concerns.  

## 2016-09-03 ENCOUNTER — Encounter: Payer: Self-pay | Admitting: General Surgery

## 2016-09-03 ENCOUNTER — Ambulatory Visit (INDEPENDENT_AMBULATORY_CARE_PROVIDER_SITE_OTHER): Payer: 59 | Admitting: General Surgery

## 2016-09-03 VITALS — BP 132/70 | HR 76 | Temp 98.3°F | Resp 14 | Ht 69.0 in | Wt 219.0 lb

## 2016-09-03 DIAGNOSIS — L732 Hidradenitis suppurativa: Secondary | ICD-10-CM

## 2016-09-03 MED ORDER — HYDROCODONE-ACETAMINOPHEN 5-325 MG PO TABS: 1.0000 | ORAL_TABLET | ORAL | 0 refills | Status: DC | PRN

## 2016-09-03 MED ORDER — SILVER SULFADIAZINE 1 % EX CREA: TOPICAL_CREAM | CUTANEOUS | 1 refills | Status: DC

## 2016-09-03 NOTE — Progress Notes (Signed)
Patient ID: Carla Payne, female   DOB: 07/05/1985, 31 y.o.   MRN: [id number]  Chief Complaint  Patient presents with  . Routine Post Op    HPI Carla Payne is a 31 y.o. female here today for her post op exesion left axillary done on 08/24/2016. Patient states a area has opened up and the staple came out. HPI  Past Medical History:  Diagnosis Date  . Anxiety   . Diabetes mellitus without complication ([place]) 4081  . Hidradenitis 2014  . Hyperlipidemia   . Pilonidal cyst 2012    Past Surgical History:  Procedure Laterality Date  . AXILLARY HIDRADENITIS EXCISION  01-05-13  . axillary mass Left 2014 ,may  . HYDRADENITIS EXCISION Bilateral 10/12/2014   Procedure: EXCISION HIDRADENITIS GROIN;  Surgeon: Robert Bellow, MD;  Location: [facility] ORS;  Service: General;  Laterality: Bilateral;  . HYDRADENITIS EXCISION Left 08/24/2016   Procedure: EXCISION HIDRADENITIS AXILLA;  Surgeon: Robert Bellow, MD;  Location: [facility] ORS;  Service: General;  Laterality: Left;  . PERINEAL HIDRADENITIS EXCISION      Family History  Problem Relation Age of Onset  . Hypertension Father     Social History Social History  Substance Use Topics  . Smoking status: Current Every Day Smoker    Packs/day: 0.50    Years: 13.00    Types: Cigarettes  . Smokeless tobacco: Never Used  . Alcohol use Yes     Comment: wine on weekends    No Known Allergies  Current Outpatient Prescriptions  Medication Sig Dispense Refill  . acetaminophen (TYLENOL) 500 MG tablet Take 2,000 mg by mouth 2 (two) times daily.    Marland Kitchen etonogestrel (NEXPLANON) 68 MG IMPL implant 1 each by Subdermal route once.    . fluconazole (DIFLUCAN) 150 MG tablet Take 1 tablet (150 mg total) by mouth as needed. Take on day 1 and day 7 2 tablet 0  . glipiZIDE (GLUCOTROL XL) 2.5 MG 24 hr tablet Take 2.5 mg by mouth daily with breakfast.    . HUMALOG KWIKPEN 100 UNIT/ML KiwkPen Inject into the skin. As needed when glucose levels are 130  (based on sliding dosing)    . HYDROcodone-acetaminophen (NORCO) 7.5-325 MG tablet Take 1 tablet by mouth every 4 (four) hours as needed for moderate pain. 30 tablet 0  . naproxen sodium (ALEVE) 220 MG tablet Take 2 tablets (440 mg total) by mouth 2 (two) times daily with a meal. 60 tablet 0  . simvastatin (ZOCOR) 10 MG tablet Take 10 mg by mouth daily at 6 PM.     . SitaGLIPtin-MetFORMIN HCl (JANUMET XR) [phone] MG TB24 Take 1 tablet by mouth daily.    Marland Kitchen sulfamethoxazole-trimethoprim (BACTRIM DS,SEPTRA DS) 800-160 MG tablet Take 1 tablet by mouth 2 (two) times daily. 28 tablet 0  . HYDROcodone-acetaminophen (NORCO/VICODIN) 5-325 MG tablet Take 1 tablet by mouth every 4 (four) hours as needed for moderate pain. 30 tablet 0  . silver sulfADIAZINE (SILVADENE) 1 % cream Apply to affected area twice daily 50 g 1   No current facility-administered medications for this visit.     Review of Systems Review of Systems  Constitutional: Negative.   Respiratory: Negative.   Cardiovascular: Negative.     Blood pressure 132/70, pulse 76, temperature 98.3 F (36.8 C), resp. rate 14, height 5\' 9"  (1.753 m), weight 219 lb (99.3 kg), last menstrual period 08/23/2016.  Physical Exam Physical Exam  Pulmonary/Chest:        Assessment  Wound separation. No evidence of deep infection.    Plan    Continue use of local heat for comfort.  Silvadene cream topically in the areas wound separation.  Renewal of [place], #30 with the inscription one every 4 hours if needed. Continue Aleve twice a day.  We'll postpone tomorrow scheduled follow-up until Thursday, August 23.       Robert Bellow 09/03/2016, 11:28 AM

## 2016-09-04 ENCOUNTER — Ambulatory Visit: Payer: 59 | Admitting: General Surgery

## 2016-09-06 ENCOUNTER — Encounter: Payer: Self-pay | Admitting: General Surgery

## 2016-09-06 ENCOUNTER — Ambulatory Visit (INDEPENDENT_AMBULATORY_CARE_PROVIDER_SITE_OTHER): Payer: 59 | Admitting: General Surgery

## 2016-09-06 VITALS — BP 126/72 | HR 116 | Resp 14 | Ht 69.0 in | Wt 218.0 lb

## 2016-09-06 DIAGNOSIS — L732 Hidradenitis suppurativa: Secondary | ICD-10-CM

## 2016-09-06 NOTE — Progress Notes (Signed)
Patient ID: Carla Payne, female   DOB: 02-12-1985, 31 y.o.   MRN: [id number]  Chief Complaint  Patient presents with  . Routine Post Op    HPI Carla Payne is a 31 y.o. female.  here today for her post op exesion left axillary done on 08/24/2016. Patient states even more of an area has opened up and the staples came out. She is here with her significant other, Carla Payne.  HPI  Past Medical History:  Diagnosis Date  . Anxiety   . Diabetes mellitus without complication ([place]) 8676  . Hidradenitis 2014  . Hyperlipidemia   . Pilonidal cyst 2012    Past Surgical History:  Procedure Laterality Date  . AXILLARY HIDRADENITIS EXCISION  01-05-13  . axillary mass Left 2014 ,may  . HYDRADENITIS EXCISION Bilateral 10/12/2014   Procedure: EXCISION HIDRADENITIS GROIN;  Surgeon: Robert Bellow, MD;  Location: [facility];  Service: General;  Laterality: Bilateral;  . HYDRADENITIS EXCISION Left 08/24/2016   Procedure: EXCISION HIDRADENITIS AXILLA;  Surgeon: Robert Bellow, MD;  Location: [facility];  Service: General;  Laterality: Left;  . PERINEAL HIDRADENITIS EXCISION      Family History  Problem Relation Age of Onset  . Hypertension Father     Social History Social History  Substance Use Topics  . Smoking status: Current Every Day Smoker    Packs/day: 0.50    Years: 13.00    Types: Cigarettes  . Smokeless tobacco: Never Used  . Alcohol use Yes     Comment: wine on weekends    No Known Allergies  Current Outpatient Prescriptions  Medication Sig Dispense Refill  . acetaminophen (TYLENOL) 500 MG tablet Take 2,000 mg by mouth 2 (two) times daily.    Marland Kitchen etonogestrel (NEXPLANON) 68 MG IMPL implant 1 each by Subdermal route once.    . fluconazole (DIFLUCAN) 150 MG tablet Take 1 tablet (150 mg total) by mouth as needed. Take on day 1 and day 7 2 tablet 0  . glipiZIDE (GLUCOTROL XL) 2.5 MG 24 hr tablet Take 2.5 mg by mouth daily with breakfast.    . HUMALOG KWIKPEN 100 UNIT/ML  KiwkPen Inject into the skin. As needed when glucose levels are 130 (based on sliding dosing)    . HYDROcodone-acetaminophen (NORCO) 7.5-325 MG tablet Take 1 tablet by mouth every 4 (four) hours as needed for moderate pain. 30 tablet 0  . HYDROcodone-acetaminophen (NORCO/VICODIN) 5-325 MG tablet Take 1 tablet by mouth every 4 (four) hours as needed for moderate pain. 30 tablet 0  . naproxen sodium (ALEVE) 220 MG tablet Take 2 tablets (440 mg total) by mouth 2 (two) times daily with a meal. 60 tablet 0  . silver sulfADIAZINE (SILVADENE) 1 % cream Apply to affected area twice daily 50 g 1  . simvastatin (ZOCOR) 10 MG tablet Take 10 mg by mouth daily at 6 PM.     . SitaGLIPtin-MetFORMIN HCl (JANUMET XR) [phone] MG TB24 Take 1 tablet by mouth daily.    Marland Kitchen sulfamethoxazole-trimethoprim (BACTRIM DS,SEPTRA DS) 800-160 MG tablet Take 1 tablet by mouth 2 (two) times daily. 28 tablet 0   No current facility-administered medications for this visit.     Review of Systems Review of Systems  Constitutional: Negative.   Respiratory: Negative.   Cardiovascular: Negative.     Blood pressure 126/72, pulse (!) 116, resp. rate 14, height 5\' 9"  (1.753 m), weight 218 lb (98.9 kg), last menstrual period 08/23/2016.  Physical Exam Physical Exam  Constitutional:  She is oriented to person, place, and time. She appears well-developed and well-nourished.  Pulmonary/Chest:    Neurological: She is alert and oriented to person, place, and time.  Skin: Skin is warm and dry.  Psychiatric: Her behavior is normal.    Data Reviewed Intraoperative culture showed a few gram-positive cocci, mixed normal skin flora.  Assessment    Progressive wound separation without dehiscence of the adipose tissue layer.   Plan    Continue local wound care.    Recommend smoking sensation. Continue to use heating pad as needed. Continue Aleve BID. Follow up in one week.  HPI, Physical Exam, Assessment and Plan have been  scribed under the direction and in the presence of Robert Bellow, MD. Karie Fetch, RN  I have completed the exam and reviewed the above documentation for accuracy and completeness.  I agree with the above.  Haematologist has been used and any errors in dictation or transcription are unintentional.  Hervey Ard, M.D., F.A.C.S.  Robert Bellow 09/07/2016, 4:50 PM

## 2016-09-06 NOTE — Patient Instructions (Addendum)
The patient is aware to call back for any questions or concerns. Continue to use heating pad as needed. Continue Aleve BID. Recommend smoking sensation. Follow up in one week.

## 2016-09-12 ENCOUNTER — Encounter: Payer: Self-pay | Admitting: General Surgery

## 2016-09-12 ENCOUNTER — Ambulatory Visit (INDEPENDENT_AMBULATORY_CARE_PROVIDER_SITE_OTHER): Payer: 59 | Admitting: General Surgery

## 2016-09-12 VITALS — BP 130/70 | HR 92 | Resp 14 | Ht 69.0 in | Wt 222.0 lb

## 2016-09-12 DIAGNOSIS — L732 Hidradenitis suppurativa: Secondary | ICD-10-CM

## 2016-09-12 MED ORDER — TRAMADOL HCL 50 MG PO TABS: 50.0000 mg | ORAL_TABLET | Freq: Four times a day (QID) | ORAL | 0 refills | Status: DC | PRN

## 2016-09-12 NOTE — Patient Instructions (Signed)
Return to in 10 days.

## 2016-09-12 NOTE — Progress Notes (Signed)
Patient ID: Carla Payne, female   DOB: 11-25-1985, 31 y.o.   MRN: [id number]  Chief Complaint  Patient presents with  . Follow-up    HPI Carla Payne is a 31 y.o. female here today for her follow up left axillary surgery done on 08/24/2016. Patient states the area is draining some green discharge.   HPI  Past Medical History:  Diagnosis Date  . Anxiety   . Diabetes mellitus without complication ([place]) 3614  . Hidradenitis 2014  . Hyperlipidemia   . Pilonidal cyst 2012    Past Surgical History:  Procedure Laterality Date  . AXILLARY HIDRADENITIS EXCISION  01-05-13  . axillary mass Left 2014 ,may  . HYDRADENITIS EXCISION Bilateral 10/12/2014   Procedure: EXCISION HIDRADENITIS GROIN;  Surgeon: Robert Bellow, MD;  Location: [facility];  Service: General;  Laterality: Bilateral;  . HYDRADENITIS EXCISION Left 08/24/2016   Procedure: EXCISION HIDRADENITIS AXILLA;  Surgeon: Robert Bellow, MD;  Location: [facility];  Service: General;  Laterality: Left;  . PERINEAL HIDRADENITIS EXCISION      Family History  Problem Relation Age of Onset  . Hypertension Father     Social History Social History  Substance Use Topics  . Smoking status: Current Every Day Smoker    Packs/day: 0.50    Years: 13.00    Types: Cigarettes  . Smokeless tobacco: Never Used  . Alcohol use Yes     Comment: wine on weekends    No Known Allergies  Current Outpatient Prescriptions  Medication Sig Dispense Refill  . acetaminophen (TYLENOL) 500 MG tablet Take 2,000 mg by mouth 2 (two) times daily.    Marland Kitchen etonogestrel (NEXPLANON) 68 MG IMPL implant 1 each by Subdermal route once.    . fluconazole (DIFLUCAN) 150 MG tablet Take 1 tablet (150 mg total) by mouth as needed. Take on day 1 and day 7 2 tablet 0  . glipiZIDE (GLUCOTROL XL) 2.5 MG 24 hr tablet Take 2.5 mg by mouth daily with breakfast.    . HUMALOG KWIKPEN 100 UNIT/ML KiwkPen Inject into the skin. As needed when glucose levels are 130 (based on  sliding dosing)    . HYDROcodone-acetaminophen (NORCO) 7.5-325 MG tablet Take 1 tablet by mouth every 4 (four) hours as needed for moderate pain. 30 tablet 0  . HYDROcodone-acetaminophen (NORCO/VICODIN) 5-325 MG tablet Take 1 tablet by mouth every 4 (four) hours as needed for moderate pain. 30 tablet 0  . naproxen sodium (ALEVE) 220 MG tablet Take 2 tablets (440 mg total) by mouth 2 (two) times daily with a meal. 60 tablet 0  . silver sulfADIAZINE (SILVADENE) 1 % cream Apply to affected area twice daily 50 g 1  . simvastatin (ZOCOR) 10 MG tablet Take 10 mg by mouth daily at 6 PM.     . SitaGLIPtin-MetFORMIN HCl (JANUMET XR) [phone] MG TB24 Take 1 tablet by mouth daily.    . traMADol (ULTRAM) 50 MG tablet Take 1 tablet (50 mg total) by mouth every 6 (six) hours as needed. 30 tablet 0   No current facility-administered medications for this visit.     Review of Systems Review of Systems  Constitutional: Negative.   Respiratory: Negative.   Cardiovascular: Negative.     Blood pressure 130/70, pulse 92, resp. rate 14, height 5\' 9"  (1.753 m), weight 222 lb (100.7 kg), last menstrual period 08/23/2016.  Physical Exam Physical Exam  Constitutional: She is oriented to person, place, and time. She appears well-developed and well-nourished.  Pulmonary/Chest:  Patient shows good shoulder range of motion.  Neurological: She is alert and oriented to person, place, and time.  Skin: Skin is warm and dry.   Staples was removed .   Assessment    Stead progress in healing of left axillary wound.    Plan    The patient used 30 Norco tablets in 9 days. We reviewed the importance of limiting long narcotic exposure. Amenable to a stepdown to tramadol 50 mg every 6 hours if needed.    Patient to return in 10 days.   HPI, Physical Exam, Assessment and Plan have been scribed under the direction and in the presence of Hervey Ard, MD.  Gaspar Cola, CMA  I have completed the exam and  reviewed the above documentation for accuracy and completeness.  I agree with the above.  Haematologist has been used and any errors in dictation or transcription are unintentional.  Hervey Ard, M.D., F.A.C.S.  Robert Bellow 09/13/2016, 10:16 AM

## 2016-09-25 ENCOUNTER — Encounter: Payer: Self-pay | Admitting: General Surgery

## 2016-09-25 ENCOUNTER — Ambulatory Visit (INDEPENDENT_AMBULATORY_CARE_PROVIDER_SITE_OTHER): Payer: 59 | Admitting: General Surgery

## 2016-09-25 VITALS — BP 130/78 | HR 106 | Resp 12 | Ht 69.0 in | Wt 222.0 lb

## 2016-09-25 DIAGNOSIS — L732 Hidradenitis suppurativa: Secondary | ICD-10-CM

## 2016-09-25 DIAGNOSIS — L723 Sebaceous cyst: Secondary | ICD-10-CM | POA: Diagnosis not present

## 2016-09-25 NOTE — Progress Notes (Signed)
Patient ID: Carla Payne, female   DOB: March 12, 1985, 31 y.o.   MRN: [id number]  Chief Complaint  Patient presents with  . Routine Post Op    HPI Carla Payne is a 31 y.o. female.  here today for her follow up left axillary surgery done on 08/24/2016. Patient states the area is draining some light green discharge but it is a lot less. She state she has some pain near her elbow when she tries to straighten her arm.Marked decrease in pain.  She states she has a knot that is getting larger near her left eye.  HPI  Past Medical History:  Diagnosis Date  . Anxiety   . Diabetes mellitus without complication ([place]) 2229  . Hidradenitis 2014  . Hyperlipidemia   . Pilonidal cyst 2012    Past Surgical History:  Procedure Laterality Date  . AXILLARY HIDRADENITIS EXCISION  01-05-13  . axillary mass Left 2014 ,may  . HYDRADENITIS EXCISION Bilateral 10/12/2014   Procedure: EXCISION HIDRADENITIS GROIN;  Surgeon: Robert Bellow, MD;  Location: [facility];  Service: General;  Laterality: Bilateral;  . HYDRADENITIS EXCISION Left 08/24/2016   Procedure: EXCISION HIDRADENITIS AXILLA;  Surgeon: Robert Bellow, MD;  Location: [facility];  Service: General;  Laterality: Left;  . PERINEAL HIDRADENITIS EXCISION      Family History  Problem Relation Age of Onset  . Hypertension Father     Social History Social History  Substance Use Topics  . Smoking status: Current Every Day Smoker    Packs/day: 0.50    Years: 13.00    Types: Cigarettes  . Smokeless tobacco: Never Used  . Alcohol use Yes     Comment: wine on weekends    No Known Allergies  Current Outpatient Prescriptions  Medication Sig Dispense Refill  . acetaminophen (TYLENOL) 500 MG tablet Take 2,000 mg by mouth 2 (two) times daily.    Marland Kitchen etonogestrel (NEXPLANON) 68 MG IMPL implant 1 each by Subdermal route once.    Marland Kitchen glipiZIDE (GLUCOTROL XL) 2.5 MG 24 hr tablet Take 2.5 mg by mouth daily with breakfast.    . HUMALOG KWIKPEN 100  UNIT/ML KiwkPen Inject into the skin. As needed when glucose levels are 130 (based on sliding dosing)    . naproxen sodium (ALEVE) 220 MG tablet Take 2 tablets (440 mg total) by mouth 2 (two) times daily with a meal. 60 tablet 0  . silver sulfADIAZINE (SILVADENE) 1 % cream Apply to affected area twice daily 50 g 1  . simvastatin (ZOCOR) 10 MG tablet Take 10 mg by mouth daily at 6 PM.     . SitaGLIPtin-MetFORMIN HCl (JANUMET XR) [phone] MG TB24 Take 1 tablet by mouth daily.     No current facility-administered medications for this visit.     Review of Systems Review of Systems  Constitutional: Negative.   Respiratory: Negative.   Cardiovascular: Negative.     Blood pressure 130/78, pulse (!) 106, resp. rate 12, height 5\' 9"  (1.753 m), weight 222 lb (100.7 kg).   Physical Exam Physical Exam  Constitutional: She is oriented to person, place, and time. She appears well-developed and well-nourished.  HENT:  Head:    Pulmonary/Chest:    Neurological: She is alert and oriented to person, place, and time.  Skin: Skin is warm and dry.  inflamed cyst above left brow. Left axillary area is healing, silver nitrate applied  Psychiatric: Her behavior is normal.       Assessment    Sebaceous  cyst of the face.    Plan    The area looked to be amenable to excision. 8 mL of 0.5% Xylocaine with 0.25% Marcaine with 1-200,000 of epinephrine was instilled after alcohol prep. Betadine was applied to the area 3. Through elliptical incision the lesion was removed. The cyst wall appeared to of liquefied. The base was debrided with a gauze sponge. The wound was closed with 4-0 nylon sutures 2. Telfa and Tegaderm dressing applied. Specimen sent for routine histology.      Pathology to [facility] Follow up in one week suture removal Follow up with MD in 2 weeks  HPI, Physical Exam, Assessment and Plan have been scribed under the direction and in the presence of Robert Bellow, MD. Karie Fetch, RN  Hernia precautions and incarceration were discussed with the patient. If they develop symptoms of an incarcerated hernia, they were encouraged to seek prompt medical attention.  I have recommended repair of the hernia using mesh on an outpatient basis in the near future. The risk of infection was reviewed. The role of prosthetic mesh to minimize the risk of recurrence was reviewed.  Robert Bellow 09/25/2016, 6:03 PM

## 2016-09-25 NOTE — Patient Instructions (Addendum)
The patient is aware to call back for any questions or concerns. Follow up in one week suture removal May shower

## 2016-09-27 LAB — PATHOLOGY

## 2016-10-02 ENCOUNTER — Ambulatory Visit (INDEPENDENT_AMBULATORY_CARE_PROVIDER_SITE_OTHER): Payer: 59 | Admitting: *Deleted

## 2016-10-02 DIAGNOSIS — L723 Sebaceous cyst: Secondary | ICD-10-CM

## 2016-10-02 NOTE — Progress Notes (Signed)
Patient ID: Carla Payne, female   DOB: 08-Oct-1985, 31 y.o.   MRN: [id number]  Patient came in today for a wound check/suture removal. The sutures were removed and steri strips applied.   The wound is clean, with no signs of infection noted. Follow up as scheduled.  Aware of pathology.

## 2016-10-02 NOTE — Patient Instructions (Signed)
The patient is aware to call back for any questions or concerns.  

## 2016-10-09 ENCOUNTER — Encounter: Payer: Self-pay | Admitting: General Surgery

## 2016-10-09 ENCOUNTER — Ambulatory Visit (INDEPENDENT_AMBULATORY_CARE_PROVIDER_SITE_OTHER): Payer: 59 | Admitting: General Surgery

## 2016-10-09 VITALS — BP 124/78 | HR 112 | Resp 12 | Ht 69.0 in | Wt 222.0 lb

## 2016-10-09 DIAGNOSIS — L732 Hidradenitis suppurativa: Secondary | ICD-10-CM

## 2016-10-09 NOTE — Patient Instructions (Signed)
Return in six weeks

## 2016-10-09 NOTE — Progress Notes (Signed)
Patient ID: Carla Payne, female   DOB: 08-Apr-1985, 31 y.o.   MRN: [id number]  Chief Complaint  Patient presents with  . Follow-up    HPI Carla Payne is a 31 y.o. female here today for her follow up left axillary surgery done on 08/24/2016. Patient states the area is closed and much better.  HPI  Past Medical History:  Diagnosis Date  . Anxiety   . Diabetes mellitus without complication ([place]) 9371  . Hidradenitis 2014  . Hyperlipidemia   . Pilonidal cyst 2012    Past Surgical History:  Procedure Laterality Date  . AXILLARY HIDRADENITIS EXCISION  01-05-13  . axillary mass Left 2014 ,may  . HYDRADENITIS EXCISION Bilateral 10/12/2014   Procedure: EXCISION HIDRADENITIS GROIN;  Surgeon: Robert Bellow, MD;  Location: [facility];  Service: General;  Laterality: Bilateral;  . HYDRADENITIS EXCISION Left 08/24/2016   Procedure: EXCISION HIDRADENITIS AXILLA;  Surgeon: Robert Bellow, MD;  Location: [facility];  Service: General;  Laterality: Left;  . PERINEAL HIDRADENITIS EXCISION      Family History  Problem Relation Age of Onset  . Hypertension Father     Social History Social History  Substance Use Topics  . Smoking status: Current Every Day Smoker    Packs/day: 0.50    Years: 13.00    Types: Cigarettes  . Smokeless tobacco: Never Used  . Alcohol use Yes     Comment: wine on weekends    No Known Allergies  Current Outpatient Prescriptions  Medication Sig Dispense Refill  . acetaminophen (TYLENOL) 500 MG tablet Take 2,000 mg by mouth 2 (two) times daily.    Marland Kitchen etonogestrel (NEXPLANON) 68 MG IMPL implant 1 each by Subdermal route once.    Marland Kitchen glipiZIDE (GLUCOTROL XL) 2.5 MG 24 hr tablet Take 2.5 mg by mouth daily with breakfast.    . HUMALOG KWIKPEN 100 UNIT/ML KiwkPen Inject into the skin. As needed when glucose levels are 130 (based on sliding dosing)    . naproxen sodium (ALEVE) 220 MG tablet Take 2 tablets (440 mg total) by mouth 2 (two) times daily with a meal.  60 tablet 0  . silver sulfADIAZINE (SILVADENE) 1 % cream Apply to affected area twice daily 50 g 1  . simvastatin (ZOCOR) 10 MG tablet Take 10 mg by mouth daily at 6 PM.     . SitaGLIPtin-MetFORMIN HCl (JANUMET XR) [phone] MG TB24 Take 1 tablet by mouth daily.     No current facility-administered medications for this visit.     Review of Systems Review of Systems  Constitutional: Negative.   Respiratory: Negative.   Cardiovascular: Negative.     Blood pressure 124/78, pulse (!) 112, resp. rate 12, height 5\' 9"  (1.753 m), weight 222 lb (100.7 kg).  Physical Exam Physical Exam  Pulmonary/Chest:         Assessment    Complete healing of left axillary hidradenitis excision site.    Plan         Patient to return to in six weeks. The patient is aware to call back for any questions or concerns.    HPI, Physical Exam, Assessment and Plan have been scribed under the direction and in the presence of Hervey Ard, MD.  Gaspar Cola, CMA  I have completed the exam and reviewed the above documentation for accuracy and completeness.  I agree with the above.  Haematologist has been used and any errors in dictation or transcription are unintentional.  Hervey Ard, M.D.,  F.A.C.S.   Robert Bellow 10/10/2016, 7:33 PM

## 2016-10-25 ENCOUNTER — Encounter: Payer: Self-pay | Admitting: General Surgery

## 2016-11-22 ENCOUNTER — Ambulatory Visit (INDEPENDENT_AMBULATORY_CARE_PROVIDER_SITE_OTHER): Payer: 59 | Admitting: General Surgery

## 2016-11-22 ENCOUNTER — Encounter: Payer: Self-pay | Admitting: General Surgery

## 2016-11-22 VITALS — BP 128/72 | HR 78 | Resp 12 | Ht 69.0 in | Wt 225.0 lb

## 2016-11-22 DIAGNOSIS — L732 Hidradenitis suppurativa: Secondary | ICD-10-CM

## 2016-11-22 NOTE — Patient Instructions (Addendum)
Patient to return in one month. The patient is aware to call back for any questions or concerns. 

## 2016-11-22 NOTE — Progress Notes (Signed)
Patient ID: Carla Payne, female   DOB: 1985/03/06, 31 y.o.   MRN: [id number]  No chief complaint on file.   HPI Carla Payne is a 31 y.o. female here today for her follow up hidradenitis. Patient states the area is doing much better, not much drainage.  HPI  Past Medical History:  Diagnosis Date  . Anxiety   . Diabetes mellitus without complication ([place]) 4010  . Hidradenitis 2014  . Hyperlipidemia   . Pilonidal cyst 2012    Past Surgical History:  Procedure Laterality Date  . AXILLARY HIDRADENITIS EXCISION  01-05-13  . axillary mass Left 2014 ,may  . PERINEAL HIDRADENITIS EXCISION     HYDRADENITIS EXCISION Bilateral 10/12/2014    Procedure: EXCISION HIDRADENITIS GROIN;  Surgeon: Robert Bellow, MD;  Location: [facility];  Service: General;  Laterality: Bilateral;  . HYDRADENITIS EXCISION Left 08/24/2016   Procedure: EXCISION HIDRADENITIS AXILLA;  Surgeon: Robert Bellow, MD;  Location: [facility];  Service: General;  Laterality: Left;     Family History  Problem Relation Age of Onset  . Hypertension Father     Social History Social History   Tobacco Use  . Smoking status: Current Every Day Smoker    Packs/day: 0.50    Years: 13.00    Pack years: 6.50    Types: Cigarettes  . Smokeless tobacco: Never Used  Substance Use Topics  . Alcohol use: Yes    Comment: wine on weekends  . Drug use: No    No Known Allergies  Current Outpatient Medications  Medication Sig Dispense Refill  . acetaminophen (TYLENOL) 500 MG tablet Take 2,000 mg by mouth 2 (two) times daily.    Marland Kitchen etonogestrel (NEXPLANON) 68 MG IMPL implant 1 each by Subdermal route once.    Marland Kitchen glipiZIDE (GLUCOTROL XL) 2.5 MG 24 hr tablet Take 2.5 mg by mouth daily with breakfast.    . HUMALOG KWIKPEN 100 UNIT/ML KiwkPen Inject into the skin. As needed when glucose levels are 130 (based on sliding dosing)    . naproxen sodium (ALEVE) 220 MG tablet Take 2 tablets (440 mg total) by mouth 2 (two) times  daily with a meal. 60 tablet 0  . silver sulfADIAZINE (SILVADENE) 1 % cream Apply to affected area twice daily 50 g 1  . simvastatin (ZOCOR) 10 MG tablet Take 10 mg by mouth daily at 6 PM.     . SitaGLIPtin-MetFORMIN HCl (JANUMET XR) [phone] MG TB24 Take 1 tablet by mouth daily.     No current facility-administered medications for this visit.     Review of Systems Review of Systems  Constitutional: Negative.   Respiratory: Negative.   Cardiovascular: Negative.     Blood pressure 128/72, pulse 78, resp. rate 12, height 5\' 9"  (1.753 m), weight 225 lb (102.1 kg).  Physical Exam Physical Exam  Constitutional: She is oriented to person, place, and time. She appears well-developed and well-nourished.  Pulmonary/Chest:    Neurological: She is alert and oriented to person, place, and time.  Skin: Skin is warm and dry.      Assessment    Excellent healing, mild scar thickening.    Plan    Patient encouraged to use gentle massage with oil or hand cream to decrease friction to the area of thickening to help resolve this more quickly.    Patient to return in one month. he patient is aware to call back for any questions or concerns.  HPI, Physical Exam, Assessment and Plan have  been scribed under the direction and in the presence of Hervey Ard, MD.  Gaspar Cola, CMA \ I have completed the exam and reviewed the above documentation for accuracy and completeness.  I agree with the above.  Haematologist has been used and any errors in dictation or transcription are unintentional.  Hervey Ard, M.D., F.A.C.S.  Robert Bellow 11/22/2016, 2:30 PM

## 2016-12-24 ENCOUNTER — Ambulatory Visit: Payer: 59 | Admitting: General Surgery

## 2016-12-26 ENCOUNTER — Telehealth: Payer: Self-pay

## 2016-12-26 ENCOUNTER — Ambulatory Visit: Payer: 59 | Admitting: General Surgery

## 2016-12-26 NOTE — Telephone Encounter (Signed)
Left message for the patient to call back to rescheduled her missed appointment.

## 2017-01-30 ENCOUNTER — Emergency Department
Admission: EM | Admit: 2017-01-30 | Discharge: 2017-01-30 | Disposition: A | Discharge status: Home / Self Care | Payer: Managed Care, Other (non HMO) | Attending: Emergency Medicine | Admitting: Emergency Medicine

## 2017-01-30 ENCOUNTER — Encounter: Payer: Self-pay | Admitting: Emergency Medicine

## 2017-01-30 DIAGNOSIS — Z79899 Other long term (current) drug therapy: Secondary | ICD-10-CM | POA: Insufficient documentation

## 2017-01-30 DIAGNOSIS — F17210 Nicotine dependence, cigarettes, uncomplicated: Secondary | ICD-10-CM | POA: Insufficient documentation

## 2017-01-30 DIAGNOSIS — Z794 Long term (current) use of insulin: Secondary | ICD-10-CM | POA: Insufficient documentation

## 2017-01-30 DIAGNOSIS — L0231 Cutaneous abscess of buttock: Secondary | ICD-10-CM | POA: Insufficient documentation

## 2017-01-30 DIAGNOSIS — E119 Type 2 diabetes mellitus without complications: Secondary | ICD-10-CM | POA: Diagnosis not present

## 2017-01-30 MED ORDER — OXYCODONE-ACETAMINOPHEN 7.5-325 MG PO TABS
ORAL_TABLET | ORAL | Status: AC
  Administered 2017-01-30: 1 via ORAL
  Filled 2017-01-30: qty 1

## 2017-01-30 MED ORDER — SULFAMETHOXAZOLE-TRIMETHOPRIM 800-160 MG PO TABS: 1.0000 | ORAL_TABLET | Freq: Two times a day (BID) | ORAL | 0 refills | Status: DC

## 2017-01-30 MED ORDER — LIDOCAINE HCL (PF) 1 % IJ SOLN
INTRAMUSCULAR | Status: AC
  Administered 2017-01-30: 5 mL
  Filled 2017-01-30: qty 5

## 2017-01-30 MED ORDER — OXYCODONE-ACETAMINOPHEN 7.5-325 MG PO TABS
1.0000 | ORAL_TABLET | Freq: Once | ORAL | Status: AC
  Administered 2017-01-30: 1 via ORAL

## 2017-01-30 MED ORDER — LIDOCAINE HCL (PF) 1 % IJ SOLN
5.0000 mL | Freq: Once | INTRAMUSCULAR | Status: AC
  Administered 2017-01-30: 5 mL

## 2017-01-30 MED ORDER — OXYCODONE-ACETAMINOPHEN 5-325 MG PO TABS: 1.0000 | ORAL_TABLET | Freq: Four times a day (QID) | ORAL | 0 refills | Status: DC | PRN

## 2017-01-30 NOTE — ED Notes (Signed)
See triage note.

## 2017-01-30 NOTE — Discharge Instructions (Signed)
Begin taking Bactrim DS twice daily for 10 days and Percocet 1 every 6 hours as needed for pain.  Do not drive while taking this medication.  Follow-up with your primary care doctor if any continued problems.  Return to the ED in 2 days if the drain has not fallen out on its own.  You may also end up seeing Dr. Tollie Pizza again if you continue to have problems in the site.

## 2017-01-30 NOTE — ED Notes (Signed)
Pt in NAD at time of d/c, verbalizes d/c understanding

## 2017-01-30 NOTE — ED Provider Notes (Signed)
[facility] Emergency Department Provider Note   ____________________________________________   First MD Initiated Contact with Patient 01/30/17 [phone]     (approximate)  I have reviewed the triage vital signs and the nursing notes.   HISTORY  Chief Complaint Abscess   HPI Carla Payne is a 32 y.o. female is here with complaint of abscess in the genital area for approximately 4 days.  Patient has a history of abscesses in the axilla with hidradenitis excision in the past.  Patient denies any nausea or vomiting.  She rates her pain as 10/10.   Past Medical History:  Diagnosis Date  . Anxiety   . Diabetes mellitus without complication ([place]) 2637  . Hidradenitis 2014  . Hyperlipidemia   . Pilonidal cyst 2012    Patient Active Problem List   Diagnosis Date Noted  . Inflamed sebaceous cyst 09/25/2016  . Lymphocytosis 10/31/2015  . Hidradenitis suppurativa of left axilla 10/08/2012    Past Surgical History:  Procedure Laterality Date  . AXILLARY HIDRADENITIS EXCISION  01-05-13  . axillary mass Left 2014 ,may  . HYDRADENITIS EXCISION Bilateral 10/12/2014   Procedure: EXCISION HIDRADENITIS GROIN;  Surgeon: Robert Bellow, MD;  Location: [facility] ORS;  Service: General;  Laterality: Bilateral;  . HYDRADENITIS EXCISION Left 08/24/2016   Procedure: EXCISION HIDRADENITIS AXILLA;  Surgeon: Robert Bellow, MD;  Location: [facility] ORS;  Service: General;  Laterality: Left;  . PERINEAL HIDRADENITIS EXCISION      Prior to Admission medications   Medication Sig Start Date End Date Taking? Authorizing Provider  acetaminophen (TYLENOL) 500 MG tablet Take 2,000 mg by mouth 2 (two) times daily.    [provider]  etonogestrel (NEXPLANON) 68 MG IMPL implant 1 each by Subdermal route once.    [provider]  glipiZIDE (GLUCOTROL XL) 2.5 MG 24 hr tablet Take 2.5 mg by mouth daily with breakfast.    [provider]  HUMALOG KWIKPEN 100  UNIT/ML KiwkPen Inject into the skin. As needed when glucose levels are 130 (based on sliding dosing) 10/17/15   [provider]  naproxen sodium (ALEVE) 220 MG tablet Take 2 tablets (440 mg total) by mouth 2 (two) times daily with a meal. 08/24/16   Byrnett, Forest Gleason, MD  oxyCODONE-acetaminophen (ROXICET) 5-325 MG tablet Take 1 tablet by mouth every 6 (six) hours as needed. 01/30/17 01/30/18  Johnn Hai, PA-C  silver sulfADIAZINE (SILVADENE) 1 % cream Apply to affected area twice daily 09/03/16 09/03/17  Robert Bellow, MD  simvastatin (ZOCOR) 10 MG tablet Take 10 mg by mouth daily at 6 PM.  09/04/14   [provider]  SitaGLIPtin-MetFORMIN HCl (JANUMET XR) [phone] MG TB24 Take 1 tablet by mouth daily.    [provider]  sulfamethoxazole-trimethoprim (BACTRIM DS,SEPTRA DS) 800-160 MG tablet Take 1 tablet by mouth 2 (two) times daily. 01/30/17   Johnn Hai, PA-C    Allergies Patient has no known allergies.  Family History  Problem Relation Age of Onset  . Hypertension Father     Social History Social History   Tobacco Use  . Smoking status: Current Every Day Smoker    Packs/day: 0.50    Years: 13.00    Pack years: 6.50    Types: Cigarettes  . Smokeless tobacco: Never Used  Substance Use Topics  . Alcohol use: Yes    Comment: wine on weekends  . Drug use: No    Review of Systems Constitutional: No fever/chills Cardiovascular: Denies  chest pain. Respiratory: Denies shortness of breath. Gastrointestinal:  No nausea, no vomiting.  Musculoskeletal: Negative for back pain. Skin: Positive for abscess. Neurological: Negative for headaches. ____________________________________________   PHYSICAL EXAM:  VITAL SIGNS: ED Triage Vitals  Enc Vitals Group     BP 01/30/17 0758 128/77     Pulse Rate 01/30/17 0758 98     Resp 01/30/17 0758 18     Temp 01/30/17 0758 98 F (36.7 C)     Temp Source 01/30/17 0758 Oral     SpO2 01/30/17 0758 100  %     Weight --      Height --      Head Circumference --      Peak Flow --      Pain Score 01/30/17 0801 10     Pain Loc --      Pain Edu? --      Excl. in [place]? --    Constitutional: Alert and oriented. Well appearing and in no acute distress. Eyes: Conjunctivae are normal.  Head: Atraumatic. Neck: No stridor.   Cardiovascular: Normal rate, regular rhythm. Grossly normal heart sounds.  Good peripheral circulation. Respiratory: Normal respiratory effort.  No retractions. Lungs CTAB. Musculoskeletal: No lower extremity tenderness nor edema.  Neurologic:  Normal speech and language. No gross focal neurologic deficits are appreciated.  Skin:  Skin is warm, dry.  There is a fluctuant abscess noted on the inner left upper buttocks area.  Genital area is spared.  Area is extremely tender to palpation.  No surrounding cellulitis is noted. Psychiatric: Mood and affect are normal. Speech and behavior are normal.  ____________________________________________   LABS (all labs ordered are listed, but only abnormal results are displayed)  Labs Reviewed - No data to display   PROCEDURES  Procedure(s) performed:  INCISION AND DRAINAGE Performed by: Johnn Hai Consent: Verbal consent obtained. Risks and benefits: risks, benefits and alternatives were discussed Type: abscess  Body area: Left inner buttocks area  Anesthesia: local infiltration  Incision was made with a scalpel.  Local anesthetic: lidocaine 1 % without epinephrine  Anesthetic total: 2 ml  Complexity: complex Blunt dissection to break up loculations  Drainage: purulent  Drainage amount: Moderate  Packing material: 1/4 in iodoform gauze  Patient tolerance: Patient tolerated the procedure well with no immediate complications.    Procedures  Critical Care performed: No  ____________________________________________   INITIAL IMPRESSION / ASSESSMENT AND PLAN / ED COURSE Patient was given Percocet prior  to procedure.  She tolerated this well.  Patient is aware that she is to begin taking Bactrim DS twice daily for 10 days and Percocet 1 every 6 hours as needed for pain.  If the drain has not fallen out in the next 2 days she is to return to the emergency department for removal.  She also is aware that she may need to follow-up with Dr. Tollie Pizza who has done surgery on her in the past.  ____________________________________________   FINAL CLINICAL IMPRESSION(S) / ED DIAGNOSES  Final diagnoses:  Abscess of left buttock     ED Discharge Orders        Ordered    oxyCODONE-acetaminophen (ROXICET) 5-325 MG tablet  Every 6 hours PRN     01/30/17 1003    sulfamethoxazole-trimethoprim (BACTRIM DS,SEPTRA DS) 800-160 MG tablet  2 times daily     01/30/17 1003       Note:  This document was prepared using Dragon voice recognition software and may include unintentional  dictation errors.    Johnn Hai, PA-C 01/30/17 1419    Schuyler Amor, MD 01/30/17 [phone]

## 2017-01-30 NOTE — ED Triage Notes (Signed)
Pt to ED via POV with c/o abscess to groin area x4 days. Denies fever at home. Pt ambulatory. VS stable

## 2017-02-06 ENCOUNTER — Encounter: Payer: Self-pay | Admitting: *Deleted

## 2017-03-19 ENCOUNTER — Emergency Department
Admission: EM | Admit: 2017-03-19 | Discharge: 2017-03-19 | Disposition: A | Discharge status: Home / Self Care | Payer: Managed Care, Other (non HMO) | Attending: Emergency Medicine | Admitting: Emergency Medicine

## 2017-03-19 ENCOUNTER — Encounter: Payer: Self-pay | Admitting: Emergency Medicine

## 2017-03-19 ENCOUNTER — Other Ambulatory Visit: Payer: Self-pay

## 2017-03-19 DIAGNOSIS — F419 Anxiety disorder, unspecified: Secondary | ICD-10-CM | POA: Insufficient documentation

## 2017-03-19 DIAGNOSIS — E119 Type 2 diabetes mellitus without complications: Secondary | ICD-10-CM | POA: Diagnosis not present

## 2017-03-19 DIAGNOSIS — L0201 Cutaneous abscess of face: Secondary | ICD-10-CM

## 2017-03-19 DIAGNOSIS — Z794 Long term (current) use of insulin: Secondary | ICD-10-CM | POA: Diagnosis not present

## 2017-03-19 DIAGNOSIS — F17210 Nicotine dependence, cigarettes, uncomplicated: Secondary | ICD-10-CM | POA: Diagnosis not present

## 2017-03-19 DIAGNOSIS — R220 Localized swelling, mass and lump, head: Secondary | ICD-10-CM | POA: Diagnosis present

## 2017-03-19 DIAGNOSIS — Z79899 Other long term (current) drug therapy: Secondary | ICD-10-CM | POA: Diagnosis not present

## 2017-03-19 MED ORDER — LIDOCAINE HCL (PF) 1 % IJ SOLN
5.0000 mL | Freq: Once | INTRAMUSCULAR | Status: AC
Start: 2017-03-19 — End: 2017-03-19
  Administered 2017-03-19: 5 mL
  Filled 2017-03-19: qty 5

## 2017-03-19 MED ORDER — SULFAMETHOXAZOLE-TRIMETHOPRIM 800-160 MG PO TABS: 1.0000 | ORAL_TABLET | Freq: Two times a day (BID) | ORAL | 0 refills | Status: DC

## 2017-03-19 MED ORDER — HYDROCODONE-ACETAMINOPHEN 5-325 MG PO TABS: 1.0000 | ORAL_TABLET | Freq: Four times a day (QID) | ORAL | 0 refills | Status: DC | PRN

## 2017-03-19 NOTE — ED Provider Notes (Signed)
[facility] Emergency Department Provider Note   ____________________________________________   First MD Initiated Contact with Patient 03/19/17 [phone]     (approximate)  I have reviewed the triage vital signs and the nursing notes.   HISTORY  Chief Complaint Cyst  HPI Carla Payne is a 32 y.o. female is here with complaint of cyst to her face that has been there for months.  She states for the last week it is gotten larger and is beginning to hurt.  She states she had one on her left side that was removed by a Psychologist, sport and exercise.  She called make an appointment and cannot be seen for 3 weeks.  Patient denies any fever or chills.  She states that she had leftover Bactrim which she has been taking 1 a day for the last 3 days.  She rates her pain as 4 out of 10.   Past Medical History:  Diagnosis Date  . Anxiety   . Diabetes mellitus without complication ([place]) 0762  . Hidradenitis 2014  . Hyperlipidemia   . Pilonidal cyst 2012    Patient Active Problem List   Diagnosis Date Noted  . Inflamed sebaceous cyst 09/25/2016  . Lymphocytosis 10/31/2015  . Hidradenitis suppurativa of left axilla 10/08/2012    Past Surgical History:  Procedure Laterality Date  . AXILLARY HIDRADENITIS EXCISION  01-05-13  . axillary mass Left 2014 ,may  . HYDRADENITIS EXCISION Bilateral 10/12/2014   Procedure: EXCISION HIDRADENITIS GROIN;  Surgeon: Robert Bellow, MD;  Location: [facility] ORS;  Service: General;  Laterality: Bilateral;  . HYDRADENITIS EXCISION Left 08/24/2016   Procedure: EXCISION HIDRADENITIS AXILLA;  Surgeon: Robert Bellow, MD;  Location: [facility] ORS;  Service: General;  Laterality: Left;  . PERINEAL HIDRADENITIS EXCISION      Prior to Admission medications   Medication Sig Start Date End Date Taking? Authorizing Provider  acetaminophen (TYLENOL) 500 MG tablet Take 2,000 mg by mouth 2 (two) times daily.    [provider]  etonogestrel (NEXPLANON) 68 MG  IMPL implant 1 each by Subdermal route once.    [provider]  glipiZIDE (GLUCOTROL XL) 2.5 MG 24 hr tablet Take 2.5 mg by mouth daily with breakfast.    [provider]  HUMALOG KWIKPEN 100 UNIT/ML KiwkPen Inject into the skin. As needed when glucose levels are 130 (based on sliding dosing) 10/17/15   [provider]  HYDROcodone-acetaminophen (NORCO) 5-325 MG tablet Take 1 tablet by mouth every 6 (six) hours as needed for moderate pain. 03/19/17   Johnn Hai, PA-C  simvastatin (ZOCOR) 10 MG tablet Take 10 mg by mouth daily at 6 PM.  09/04/14   [provider]  SitaGLIPtin-MetFORMIN HCl (JANUMET XR) [phone] MG TB24 Take 1 tablet by mouth daily.    [provider]  sulfamethoxazole-trimethoprim (BACTRIM DS,SEPTRA DS) 800-160 MG tablet Take 1 tablet by mouth 2 (two) times daily. 03/19/17   Johnn Hai, PA-C  sulfamethoxazole-trimethoprim (BACTRIM DS,SEPTRA DS) 800-160 MG tablet Take 1 tablet by mouth 2 (two) times daily. 03/19/17   Johnn Hai, PA-C    Allergies Patient has no known allergies.  Family History  Problem Relation Age of Onset  . Hypertension Father     Social History Social History   Tobacco Use  . Smoking status: Current Every Day Smoker    Packs/day: 0.50    Years: 13.00    Pack years: 6.50    Types: Cigarettes  . Smokeless tobacco: Never Used  Substance Use Topics  . Alcohol use: Yes    Comment: wine on weekends  . Drug use: No    Review of Systems Constitutional: No fever/chills Eyes: No visual changes. ENT: No sore throat. Cardiovascular: Denies chest pain. Respiratory: Denies shortness of breath. Skin: Positive for abscess right face. Neurological: Negative for headaches ____________________________________________   PHYSICAL EXAM:  VITAL SIGNS: ED Triage Vitals  Enc Vitals Group     BP 03/19/17 0835 125/88     Pulse Rate 03/19/17 0835 98     Resp 03/19/17 0835 20     Temp 03/19/17 0835  99.1 F (37.3 C)     Temp Source 03/19/17 0835 Oral     SpO2 03/19/17 0835 96 %     Weight 03/19/17 0832 220 lb (99.8 kg)     Height 03/19/17 0832 5\' 10"  (1.778 m)     Head Circumference --      Peak Flow --      Pain Score 03/19/17 0830 4     Pain Loc --      Pain Edu? --      Excl. in [place]? --    Constitutional: Alert and oriented. Well appearing and in no acute distress. Eyes: Conjunctivae are normal.  Head: Atraumatic. Neck: No stridor.   Cardiovascular: Normal rate, regular rhythm. Grossly normal heart sounds.  Good peripheral circulation. Respiratory: Normal respiratory effort.  No retractions. Lungs CTAB. Musculoskeletal: Moves upper and lower extremities without any difficulty. Neurologic:  Normal speech and language. No gross focal neurologic deficits are appreciated. No gait instability. Skin:  Skin is warm, dry and intact.  There is a 1 cm cystic lesion to the right lateral aspect of her forehead.  Area is minimally erythematous but is fluctuant and tender to palpation. Psychiatric: Mood and affect are normal. Speech and behavior are normal.  ____________________________________________   LABS (all labs ordered are listed, but only abnormal results are displayed)  Labs Reviewed - No data to display  PROCEDURES  Procedure(s) performed: INCISION AND DRAINAGE Performed by: Johnn Hai Consent: Verbal consent obtained. Risks and benefits: risks, benefits and alternatives were discussed Type: abscess  Body area: Right lateral forehead.  Anesthesia: local infiltration  Incision was made with a scalpel.  Local anesthetic: lidocaine 1 % without epinephrine  Anesthetic total: 2 ml  Complexity: complex Blunt dissection to break up loculations  Drainage: purulent  Drainage amount: Small  Packing material: 1/4 in iodoform gauze  Patient tolerance: Patient tolerated the procedure well with no immediate complications.    Procedures  Critical Care  performed: No  ____________________________________________   INITIAL IMPRESSION / ASSESSMENT AND PLAN / ED COURSE Patient is aware that she needs to have the drain removed in 2 days.  She was given a prescription for Bactrim DS twice daily for 10 days along with Norco if needed for pain for the next 2 days.  Patient is aware that she can still follow-up with Dr. Tollie Pizza if any continued problems.   ____________________________________________   FINAL CLINICAL IMPRESSION(S) / ED DIAGNOSES  Final diagnoses:  Abscess of face     ED Discharge Orders        Ordered    sulfamethoxazole-trimethoprim (BACTRIM DS,SEPTRA DS) 800-160 MG tablet  2 times daily     03/19/17 1043    sulfamethoxazole-trimethoprim (BACTRIM DS,SEPTRA DS) 800-160 MG tablet  2 times daily     03/19/17 1048    HYDROcodone-acetaminophen (NORCO) 5-325 MG tablet  Every 6 hours PRN  03/19/17 1048       Note:  This document was prepared using Dragon voice recognition software and may include unintentional dictation errors.    Johnn Hai, PA-C 03/19/17 1509    Harvest Dark, MD 03/19/17 [phone]

## 2017-03-19 NOTE — Discharge Instructions (Signed)
Keep area clean and dry.  Return to the emergency department or go to your primary care provider for removal of the drain that was placed today.  Begin taking Bactrim DS twice daily for 10 days.  Take Norco first 2 days if needed for pain.

## 2017-03-19 NOTE — ED Notes (Signed)
See triage note   Presents with possible abscess area to right temporal area  States she noticed a small area about 1 month ago  Became large this past week

## 2017-03-19 NOTE — ED Triage Notes (Signed)
C/O cyst to right temple.  States noticed cyst about one month ago, but over past week cyst has gotten larger.

## 2017-03-21 ENCOUNTER — Telehealth: Payer: Self-pay | Admitting: *Deleted

## 2017-03-21 NOTE — Telephone Encounter (Signed)
She went to the ED for a facial abscess on 03-19-17. I received FMLA paperwork for intermittent leave. Here appointment here is 04-09-17. She is aware that I need to talk with Dr Bary Castilla and get back with her, pt agrees.

## 2017-03-21 NOTE — Telephone Encounter (Signed)
We are unable to complete FMLA paper work at this time for the ED episode. Further evaluation will take place at her office visit regarding intermittent leave, pt agrees.

## 2017-04-09 ENCOUNTER — Ambulatory Visit: Payer: Managed Care, Other (non HMO) | Admitting: General Surgery

## 2017-04-09 ENCOUNTER — Encounter: Payer: Self-pay | Admitting: General Surgery

## 2017-04-09 VITALS — BP 152/82 | HR 102 | Resp 16 | Ht 69.0 in | Wt 214.0 lb

## 2017-04-09 DIAGNOSIS — L723 Sebaceous cyst: Secondary | ICD-10-CM | POA: Diagnosis not present

## 2017-04-09 MED ORDER — DEXAMETHASONE SODIUM PHOSPHATE 4 MG/ML IJ SOLN
4.0000 mg | Freq: Once | INTRAMUSCULAR | Status: AC
  Administered 2017-04-09: 4 mg via INTRAVENOUS

## 2017-04-09 NOTE — Patient Instructions (Signed)
Call and report in one week.

## 2017-04-09 NOTE — Progress Notes (Signed)
Patient ID: Carla Payne, female   DOB: [birth year], 32 y.o.   MRN: [id number]  Chief Complaint  Patient presents with  . Other    HPI Carla Payne is a 32 y.o. female here today for a evaluation of a abscess on her right side of face. She states she noticed this area about a month ago. Seen in the ER on 03/19/2017 and was put on Bactrim DS for 10 days. They drained that area at ER.  HPI  Past Medical History:  Diagnosis Date  . Anxiety   . Diabetes mellitus without complication ([place]) 4098  . Hidradenitis 2014  . Hyperlipidemia   . Pilonidal cyst 2012    Past Surgical History:  Procedure Laterality Date  . AXILLARY HIDRADENITIS EXCISION  01-05-13  . axillary mass Left 2014 ,may  . HYDRADENITIS EXCISION Bilateral 10/12/2014   Procedure: EXCISION HIDRADENITIS GROIN;  Surgeon: Robert Bellow, MD;  Location: [facility] ORS;  Service: General;  Laterality: Bilateral;  . HYDRADENITIS EXCISION Left 08/24/2016   Procedure: EXCISION HIDRADENITIS AXILLA;  Surgeon: Robert Bellow, MD;  Location: [facility] ORS;  Service: General;  Laterality: Left;  . PERINEAL HIDRADENITIS EXCISION      Family History  Problem Relation Age of Onset  . Hypertension Father     Social History Social History   Tobacco Use  . Smoking status: Current Every Day Smoker    Packs/day: 0.50    Years: 13.00    Pack years: 6.50    Types: Cigarettes  . Smokeless tobacco: Never Used  Substance Use Topics  . Alcohol use: Yes    Comment: wine on weekends  . Drug use: No    No Known Allergies  Current Outpatient Medications  Medication Sig Dispense Refill  . acetaminophen (TYLENOL) 500 MG tablet Take 2,000 mg by mouth 2 (two) times daily.    Marland Kitchen etonogestrel (NEXPLANON) 68 MG IMPL implant 1 each by Subdermal route once.    . Insulin Glargine (TOUJEO MAX SOLOSTAR [place]) Inject into the skin.    . metFORMIN (GLUCOPHAGE) 500 MG tablet Take by mouth 2 (two) times daily with a meal.    . simvastatin (ZOCOR) 10 MG  tablet Take 10 mg by mouth daily at 6 PM.      No current facility-administered medications for this visit.     Review of Systems Review of Systems  Constitutional: Negative.   Respiratory: Negative.   Cardiovascular: Negative.     Blood pressure (!) 152/82, pulse (!) 102, resp. rate 16, height 5\' 9"  (1.753 m), weight 214 lb (97.1 kg). Emergency department notes of March 19, 2017 reviewed. Physical Exam Physical Exam  Constitutional: She is oriented to person, place, and time. She appears well-developed and well-nourished.  HENT:  Head:    Neurological: She is alert and oriented to person, place, and time.  Skin: Skin is warm and dry.    8 mm nodular lateral to right eyebow.    Data Reviewed  Emergency department records of March 19, 2017 reviewed.  Assessment    Inflamed sebaceous cyst with slow resolution post incision and drainage, possible ectopic hidradenitis.    Plan  The patient was amenable to a trial of steroid injection prior to excision.  1 mg of dexamethasone (0.25 cc) was injected in the area after alcohol prep.  The procedure was well-tolerated.  If this helps with resolution no further intervention would be required.  If she fails to improve would proceed with excision.  Once again,  reviewed her ongoing smoking interferes with her body's ability to fight infection.  Call and report in one week.  The patient is aware to call back for any questions or concerns.   HPI, Physical Exam, Assessment and Plan have been scribed under the direction and in the presence of Hervey Ard, MD.  Gaspar Cola, CMA  I have completed the exam and reviewed the above documentation for accuracy and completeness.  I agree with the above.  Haematologist has been used and any errors in dictation or transcription are unintentional.  Hervey Ard, M.D., F.A.C.S.  Carla Payne 04/09/2017, 9:31 PM

## 2017-04-24 ENCOUNTER — Telehealth: Payer: Self-pay

## 2017-04-24 NOTE — Telephone Encounter (Signed)
Patient called to give a follow up report of her cyst on her face. She reports that the area is usually flat during the day, but will start to protrude in the evening. She also reports that she still has some soreness and discomfort with the area. She would like to know what her next step should be.

## 2017-05-16 ENCOUNTER — Telehealth: Payer: Self-pay

## 2017-05-16 NOTE — Telephone Encounter (Signed)
-----   Message from Robert Bellow, MD sent at 05/16/2017  3:49 PM EDT ----- If area has not resolved, arrange an appointment for excision. Thanks.

## 2017-05-16 NOTE — Telephone Encounter (Signed)
Patient states that she would like to get this area removed. She is scheduled for excision of facial cyst in office on 06/25/17 at 1:30 pm.

## 2017-06-25 ENCOUNTER — Encounter: Payer: Self-pay | Admitting: General Surgery

## 2017-06-25 ENCOUNTER — Ambulatory Visit (INDEPENDENT_AMBULATORY_CARE_PROVIDER_SITE_OTHER): Payer: Managed Care, Other (non HMO) | Admitting: General Surgery

## 2017-06-25 VITALS — BP 146/90 | HR 98 | Resp 12 | Ht 69.0 in | Wt 220.0 lb

## 2017-06-25 DIAGNOSIS — L723 Sebaceous cyst: Secondary | ICD-10-CM | POA: Diagnosis not present

## 2017-06-25 DIAGNOSIS — L732 Hidradenitis suppurativa: Secondary | ICD-10-CM

## 2017-06-25 NOTE — Progress Notes (Signed)
Patient ID: Carla Payne, female   DOB: [birth year], 32 y.o.   MRN: [id number]  Chief Complaint  Patient presents with  . Procedure    HPI Carla Payne is a 32 y.o. female.  Here for excision right facial cyst. She states there is also a knot under her right breast. She noticed this one month ago. She started Bactrim that she had at home.  HPI  Past Medical History:  Diagnosis Date  . Anxiety   . Diabetes mellitus without complication ([place]) 3419  . Hidradenitis 2014  . Hyperlipidemia   . Pilonidal cyst 2012    Past Surgical History:  Procedure Laterality Date  . AXILLARY HIDRADENITIS EXCISION  01-05-13  . axillary mass Left 2014 ,may  . HYDRADENITIS EXCISION Bilateral 10/12/2014   Procedure: EXCISION HIDRADENITIS GROIN;  Surgeon: Robert Bellow, MD;  Location: [facility] ORS;  Service: General;  Laterality: Bilateral;  . HYDRADENITIS EXCISION Left 08/24/2016   Procedure: EXCISION HIDRADENITIS AXILLA;  Surgeon: Robert Bellow, MD;  Location: [facility] ORS;  Service: General;  Laterality: Left;  . PERINEAL HIDRADENITIS EXCISION      Family History  Problem Relation Age of Onset  . Hypertension Father     Social History Social History   Tobacco Use  . Smoking status: Current Every Day Smoker    Packs/day: 0.50    Years: 13.00    Pack years: 6.50    Types: Cigarettes  . Smokeless tobacco: Never Used  Substance Use Topics  . Alcohol use: Yes    Comment: wine on weekends  . Drug use: No    No Known Allergies  Current Outpatient Medications  Medication Sig Dispense Refill  . acetaminophen (TYLENOL) 500 MG tablet Take 2,000 mg by mouth 2 (two) times daily.    Marland Kitchen etonogestrel (NEXPLANON) 68 MG IMPL implant 1 each by Subdermal route once.    . Insulin Glargine (TOUJEO MAX SOLOSTAR [place]) Inject into the skin.    . metFORMIN (GLUCOPHAGE) 500 MG tablet Take by mouth 2 (two) times daily with a meal.    . simvastatin (ZOCOR) 10 MG tablet Take 10 mg by mouth daily at 6 PM.       No current facility-administered medications for this visit.     Review of Systems Review of Systems  Constitutional: Negative.   Respiratory: Negative.   Cardiovascular: Negative.     Blood pressure (!) 146/90, pulse 98, resp. rate 12, height 5\' 9"  (1.753 m), weight 220 lb (99.8 kg), SpO2 98 %.  Physical Exam Physical Exam  Constitutional: She is oriented to person, place, and time. She appears well-developed and well-nourished.  HENT:  Head:    Pulmonary/Chest:  Abscess area under right breast    Neurological: She is alert and oriented to person, place, and time.  Skin: Skin is warm and dry.  Psychiatric: Her behavior is normal.    Data Reviewed It was elected to proceed with excision of the right forehead cyst and as well as I&D of the right inframammary fold hidradenitis.  The area was prepped with ChloraPrep and a total of 5 cc of 0.5% Xylocaine with 0.25% Marcaine with 1 to 200,000 units of epinephrine used at both locations.  The facial lesion was approached first.  The area was recleansed with ChloraPrep and draped.  Through an elliptical incision the entire cyst was excised.  Scant bleeding was noted.  The wound was approximated with interrupted 5-0 Prolene suture was used for skin closure.   The  area of the right inframammary fold was cleansed with ChloraPrep and draped.  A 6 mm skin line incision was made with drainage of a small amount of purulent fluid.  Culture obtained.  Dry dressing applied.  Assessment    Status post excision right forehead skin cyst removal.  I&D hidradenitis involving the right inframammary fold.    Plan     The patient will complete her previously prescribed course of Bactrim DS.  Return for suture removal based bar in 1 week     HPI, Physical Exam, Assessment and Plan have been scribed under the direction and in the presence of Robert Bellow, MD. Karie Fetch, RN  I have completed the exam and reviewed the above  documentation for accuracy and completeness.  I agree with the above.  Haematologist has been used and any errors in dictation or transcription are unintentional.  Hervey Ard, M.D., F.A.C.S.  Forest Gleason Nesta Scaturro 06/26/2017, 5:01 PM

## 2017-06-25 NOTE — Patient Instructions (Addendum)
The patient is aware to call back for any questions or concerns. May shower starting tomorrow Dressing or band aid to cover as desired May use an Ice pack as needed for comfort

## 2017-06-28 LAB — PATHOLOGY

## 2017-06-30 LAB — ANAEROBIC AND AEROBIC CULTURE

## 2017-07-01 ENCOUNTER — Ambulatory Visit: Payer: Managed Care, Other (non HMO)

## 2017-07-01 DIAGNOSIS — L732 Hidradenitis suppurativa: Secondary | ICD-10-CM

## 2017-07-01 DIAGNOSIS — L723 Sebaceous cyst: Secondary | ICD-10-CM

## 2017-07-01 NOTE — Progress Notes (Signed)
Patient ID: Carla Payne, female   DOB: 10-26-1985, 32 y.o.   MRN: [id number] Patient came in today for a wound check.  The wound is clean, with no signs of infection noted.   She reports that the area under her right breast closed up rather quickly and she is concerned about lingering fluid or infection. She denies any pain or discomfort in the area, but she does feel a lump there. This area is fully closed with no signs of infection noted.   Sutures removed from right temple area. The area is clean and dry.   The patient will follow up with Dr Bary Castilla next week to reassess area under right breast. She is aware to call if the area starts to become painful or develops redness.

## 2017-07-09 ENCOUNTER — Ambulatory Visit (INDEPENDENT_AMBULATORY_CARE_PROVIDER_SITE_OTHER): Payer: Managed Care, Other (non HMO) | Admitting: General Surgery

## 2017-07-09 ENCOUNTER — Encounter: Payer: Self-pay | Admitting: General Surgery

## 2017-07-09 VITALS — BP 128/82 | HR 91 | Resp 14 | Ht 69.0 in | Wt 218.0 lb

## 2017-07-09 DIAGNOSIS — L732 Hidradenitis suppurativa: Secondary | ICD-10-CM

## 2017-07-09 NOTE — Progress Notes (Signed)
Patient ID: Carla Payne, female   DOB: [birth year], 32 y.o.   MRN: [id number]  Chief Complaint  Patient presents with  . Follow-up    HPI Carla Payne is a 32 y.o. female.  Here for follow up incision and drainage chest wall hidradenitis. She states she is doing well. She feels the area closed up too quick and looks like it did before.   HPI  Past Medical History:  Diagnosis Date  . Anxiety   . Diabetes mellitus without complication ([place]) 9924  . Hidradenitis 2014  . Hyperlipidemia   . Pilonidal cyst 2012    Past Surgical History:  Procedure Laterality Date  . AXILLARY HIDRADENITIS EXCISION  01-05-13  . axillary mass Left 2014 ,may  . HYDRADENITIS EXCISION Bilateral 10/12/2014   Procedure: EXCISION HIDRADENITIS GROIN;  Surgeon: Robert Bellow, MD;  Location: [facility] ORS;  Service: General;  Laterality: Bilateral;  . HYDRADENITIS EXCISION Left 08/24/2016   Procedure: EXCISION HIDRADENITIS AXILLA;  Surgeon: Robert Bellow, MD;  Location: [facility] ORS;  Service: General;  Laterality: Left;  . PERINEAL HIDRADENITIS EXCISION      Family History  Problem Relation Age of Onset  . Hypertension Father     Social History Social History   Tobacco Use  . Smoking status: Current Every Day Smoker    Packs/day: 0.50    Years: 13.00    Pack years: 6.50    Types: Cigarettes  . Smokeless tobacco: Never Used  Substance Use Topics  . Alcohol use: Yes    Comment: wine on weekends  . Drug use: No    No Known Allergies  Current Outpatient Medications  Medication Sig Dispense Refill  . acetaminophen (TYLENOL) 500 MG tablet Take 2,000 mg by mouth 2 (two) times daily.    Marland Kitchen etonogestrel (NEXPLANON) 68 MG IMPL implant 1 each by Subdermal route once.    . Insulin Glargine (TOUJEO MAX SOLOSTAR [place]) Inject into the skin.    . metFORMIN (GLUCOPHAGE) 500 MG tablet Take by mouth 2 (two) times daily with a meal.    . metoprolol succinate (TOPROL-XL) 25 MG 24 hr tablet Take 25 mg by mouth  daily.    . simvastatin (ZOCOR) 10 MG tablet Take 10 mg by mouth daily at 6 PM.      No current facility-administered medications for this visit.     Review of Systems Review of Systems  Constitutional: Negative.   Respiratory: Negative.   Cardiovascular: Negative.     Blood pressure 128/82, pulse 91, resp. rate 14, height 5\' 9"  (1.753 m), weight 218 lb (98.9 kg), SpO2 98 %.  Physical Exam Physical Exam  Constitutional: She is oriented to person, place, and time. She appears well-developed and well-nourished.  HENT:  Head:    Right facial incision clean  Pulmonary/Chest:  Thickening along right mammary fold    Neurological: She is alert and oriented to person, place, and time.  Skin: Skin is warm and dry.  Psychiatric: Her behavior is normal.    Data Reviewed Right eyebrow area dated  June 25, 2017: Diagnosis:  FACE, RIGHT:  FOLLICULAR CYST, INFUNDIBULAR TYPE INFLAMED.   Assessment    Facial cyst, resolved post excision.  Resolution of right medial inframammary inflammation, I&D focal area without significant abscess.    Plan    Follow up as needed The patient is aware to call back for any questions or concerns.      HPI, Physical Exam, Assessment and Plan have been scribed under  the direction and in the presence of Robert Bellow, MD. Karie Fetch, RN  I have completed the exam and reviewed the above documentation for accuracy and completeness.  I agree with the above.  Haematologist has been used and any errors in dictation or transcription are unintentional.  Hervey Ard, M.D., F.A.C.S.  [name] 07/09/2017, 9:04 PM

## 2017-07-09 NOTE — Patient Instructions (Addendum)
The patient is aware to call back for any questions or new concerns.  

## 2017-10-08 ENCOUNTER — Ambulatory Visit: Payer: Managed Care, Other (non HMO) | Admitting: Surgery

## 2017-10-08 ENCOUNTER — Encounter: Payer: Self-pay | Admitting: Surgery

## 2017-10-08 VITALS — BP 131/92 | HR 102 | Temp 97.7°F | Resp 12 | Ht 69.0 in | Wt 216.0 lb

## 2017-10-08 DIAGNOSIS — L02213 Cutaneous abscess of chest wall: Secondary | ICD-10-CM

## 2017-10-08 DIAGNOSIS — N611 Abscess of the breast and nipple: Secondary | ICD-10-CM

## 2017-10-08 HISTORY — PX: BREAST CYST ASPIRATION: SHX578

## 2017-10-08 MED ORDER — FLUCONAZOLE 150 MG PO TABS: 150.0000 mg | ORAL_TABLET | Freq: Every day | ORAL | 0 refills | Status: DC

## 2017-10-08 MED ORDER — SULFAMETHOXAZOLE-TRIMETHOPRIM 800-160 MG PO TABS: 1.0000 | ORAL_TABLET | Freq: Two times a day (BID) | ORAL | 0 refills | Status: DC

## 2017-10-08 NOTE — Patient Instructions (Signed)
Follow up in one week  Skin Abscess A skin abscess is an infected area on or under your skin that contains pus and other material. An abscess can happen almost anywhere on your body. Some abscesses break open (rupture) on their own. Most continue to get worse unless they are treated. The infection can spread deeper into the body and into your blood, which can make you feel sick. Treatment usually involves draining the abscess. Follow these instructions at home: Abscess Care  If you have an abscess that has not drained, place a warm, clean, wet washcloth over the abscess several times a day. Do this as told by your doctor.  Follow instructions from your doctor about how to take care of your abscess. Make sure you: ? Cover the abscess with a bandage (dressing). ? Change your bandage or gauze as told by your doctor. ? Wash your hands with soap and water before you change the bandage or gauze. If you cannot use soap and water, use hand sanitizer.  Check your abscess every day for signs that the infection is getting worse. Check for: ? More redness, swelling, or pain. ? More fluid or blood. ? Warmth. ? More pus or a bad smell. Medicines   Take over-the-counter and prescription medicines only as told by your doctor.  If you were prescribed an antibiotic medicine, take it as told by your doctor. Do not stop taking the antibiotic even if you start to feel better. General instructions  To avoid spreading the infection: ? Do not share personal care items, towels, or hot tubs with others. ? Avoid making skin-to-skin contact with other people.  Keep all follow-up visits as told by your doctor. This is important. Contact a doctor if:  You have more redness, swelling, or pain around your abscess.  You have more fluid or blood coming from your abscess.  Your abscess feels warm when you touch it.  You have more pus or a bad smell coming from your abscess.  You have a fever.  Your muscles  ache.  You have chills.  You feel sick. Get help right away if:  You have very bad (severe) pain.  You see red streaks on your skin spreading away from the abscess. This information is not intended to replace advice given to you by your health care provider. Make sure you discuss any questions you have with your health care provider. Document Released: 06/20/2007 Document Revised: 08/28/2015 Document Reviewed: 11/10/2014 Elsevier Interactive Patient Education  [employer].

## 2017-10-08 NOTE — Progress Notes (Signed)
Surgical Clinic Progress/Follow-up Note   HPI:  32 y.o. Female presents to clinic for evaluation of Left breast pain. Patient reports she's previously underwent multiple excisions for groin and axillary hydradenitis and more recently a facial lesion. When seen for her facial lesion, she was also noted to have an abscess along the inframammary crease of her Right breast, which was incised and drained.   1 week ago, patient noted a painful fluctuant Left breast swelling medial to her nipple-areola, for which she took 1 Bactrim DS per day x 3 days, "leftover" from a prior prescription she did not complete because she "felt better" at that time. 3 days ago (Saturday), patient also noticed an increasingly painful and swollen pus-filled lesion of her Left breast's inframammary fold, which has become increasingly painful, for which she presents today. She denies any fever/chills or drainage. Of note, patient reports she "always" gets yeast infections with "any" antibiotics and "always" gets a prescription for diflucan when prescribed antibiotics.  Review of Systems:  Constitutional: denies any other weight loss, fever, chills, or sweats  Eyes: denies any other vision changes, history of eye injury  ENT: denies sore throat, hearing problems  Respiratory: denies shortness of breath, wheezing  Cardiovascular: denies chest pain, palpitations Breasts: pain, swelling, and drainage as per HPI Gastrointestinal: denies abdominal pain, N/V, or diarrhea Musculoskeletal: denies any other joint pains or cramps  Skin: Denies any other rashes or skin discolorations  Neurological: denies any other headache, dizziness, weakness  Psychiatric: denies any other depression, anxiety  All other review of systems: otherwise negative   Vital Signs:  BP (!) 131/92   Pulse (!) 102   Temp 97.7 F (36.5 C) (Skin)   Resp 12   Ht 5\' 9"  (1.753 m)   Wt 216 lb (98 kg)   SpO2 98%   BMI 31.90 kg/m    Physical Exam:   Constitutional:  -- Overweight body habitus  -- Awake, alert, and oriented x3  Eyes:  -- Pupils equally round and reactive to light  -- No scleral icterus  Ear, nose, throat:  -- No jugular venous distension  -- No nasal drainage, bleeding Pulmonary:  -- No crackles -- Equal breath sounds bilaterally -- Breathing non-labored at rest Cardiovascular:  -- S1, S2 present  -- No pericardial rubs  Breasts: -- Left breast tender fluctuant mildly erythematous non-draining lesions of Left breast medial to areola and along Left inframammary crease (more exquisitely tender and larger) with overlying erythema without drainage -- Bilaterally no palpable mass, nipple discharge, or axillary lymphadenopathy Gastrointestinal:  -- Soft, nontender, non-distended, no guarding/rebound  -- No abdominal masses appreciated, pulsatile or otherwise  Musculoskeletal / Integumentary:  -- Wounds or skin discoloration: None appreciated except as described above (Breasts)  -- Extremities: B/L UE and LE FROM, hands and feet warm, no edema  Neurologic:  -- Motor function: intact and symmetric  -- Sensation: intact and symmetric   Laboratory studies:  CBC: No results found for: WBC, RBC BMP:  Lab Results  Component Value Date   GLUCOSE 160 (H) 08/16/2016   CO2 26 08/16/2016   BUN 10 08/16/2016   CREATININE 0.54 08/16/2016   CALCIUM 9.5 08/16/2016     Imaging: No new pertinent imaging studies available for review   Assessment:  32 y.o. yo Female with a problem list including...  Patient Active Problem List   Diagnosis Date Noted  . Inflamed sebaceous cyst 09/25/2016  . Lymphocytosis 10/31/2015  . Hidradenitis suppurativa 10/08/2012  presents to clinic for evaluation and management of Left breast abscesses x2.  Plan:   - Bactrim DS x 7 days (complete)  - all risks, benefits, and alternatives to in-office aspiration of Left breast and inframammary crease abscesses were discussed with the  patient, all of her questions were answered to her expressed satisfaction, patient expresses she wishes to proceed, and informed consent was obtained and documented  - aspiration of 1.5 mL and 6 mL purulent fluid was performed following local anesthetic with immediate relief, dry gauze dressings applied  - return to clinic in 1 week, instructed to call office if any questions or concerns  - diflucan prescription provided with instruction to fill and take only if needed  All of the above recommendations were discussed with the patient, and all of patient's questions were answered to her expressed satisfaction.  -- Marilynne Drivers Rosana Hoes, MD, [place]: [facility] - Partnering for exceptional care. Office: [phone]

## 2017-10-12 ENCOUNTER — Encounter: Payer: Self-pay | Admitting: Surgery

## 2017-10-12 LAB — ANAEROBIC AND AEROBIC CULTURE

## 2017-10-13 NOTE — Procedures (Signed)
SURGICAL OPERATIVE REPORT  DATE OF PROCEDURE: 10/13/2017  ATTENDING: Corene Cornea E. Rosana Hoes, MD  ANESTHESIA: Local  PRE-OPERATIVE DIAGNOSIS: Left breast abscesses x2 (icd-10's: N61.1, L02.818)  POST-OPERATIVE DIAGNOSIS: Left breast abscesses x2 (icd-10's: N61.1, L02.818)  PROCEDURE(S):  1.) Percutaneous aspiration of Left breast abscesses x2 (cpt: 10160)  INTRAOPERATIVE FINDINGS: ~7.5 mL of foul-smelling tan purulent fluid under pressure from Left breast and Left inframammary crease  INTRAVENOUS FLUIDS: 0 mL crystalloid   ESTIMATED BLOOD LOSS: Minimal (<20 mL)   URINE OUTPUT: No Foley catheter   SPECIMENS: Contents of Left breast and inframammary crease abscesses for culture  IMPLANTS: None  DRAINS: None  COMPLICATIONS: None apparent  CONDITION AT END OF PROCEDURE: Hemodynamically stable and awake  DISPOSITION OF PATIENT: Office procedural suite  INDICATIONS FOR PROCEDURE:  Patient is a 32 y.o. female who presented to [facility] outpatient surgical office with Left breast and Left inframammary crease pain, erythema, and swelling x 3 & 7 days, respectively, for which patient treated herself with 3 days of antibiotics "leftover" from a prior prescription, taken only once per day, with worsening of her pain, swelling, and erythema. All risks, benefits, and alternatives to aspiration of Left breast and Left inframammary crease abscesses were discussed with the patient, all of patient's questions were answered to her expressed satisfaction, and informed consent was obtained and documented.  DETAILS OF PROCEDURE: Patient was brought to the procedure room and appropriately identified. In supine position, operative site was prepped and draped in the usual sterile fashion, and following a brief time out, the focus of maximal fluctuance over each site were identified, local anesthetic was injected subcutaneously, and a 20G needle was used  to aspirate 1.5 mL of pus from Left breast abscess and additional 6 mL of tan purulent fluid from patient's Left infra-mammary crease abscess with immediate relief of pressure and pain reported by patient. This fluid was then sent for culture. No additional pus was able to be found/aspirated.  Surrounding skin was then cleaned and dried, and a sterile dry gauze and adhesive dressing was applied. I was present for all aspects of the above procedure, and no procedural complications were apparent.

## 2017-10-15 ENCOUNTER — Ambulatory Visit: Payer: Managed Care, Other (non HMO) | Admitting: General Surgery

## 2017-10-15 HISTORY — PX: BREAST CYST ASPIRATION: SHX578

## 2017-10-17 ENCOUNTER — Encounter: Payer: Self-pay | Admitting: Surgery

## 2017-10-17 ENCOUNTER — Ambulatory Visit: Payer: Managed Care, Other (non HMO) | Admitting: Surgery

## 2017-10-17 VITALS — BP 132/86 | HR 105 | Temp 97.8°F | Wt 217.0 lb

## 2017-10-17 DIAGNOSIS — N611 Abscess of the breast and nipple: Secondary | ICD-10-CM

## 2017-10-17 NOTE — Patient Instructions (Signed)
Hidradenitis Suppurativa Hidradenitis suppurativa is a long-term (chronic) skin disease that starts with blocked sweat glands or hair follicles. Bacteria may grow in these blocked openings of your skin. Hidradenitis suppurativa is like a severe form of acne that develops in areas of your body where acne would be unusual. It is most likely to affect the areas of your body where skin rubs against skin and becomes moist. This includes your:  Underarms.  Groin.  Genital areas.  Buttocks.  Upper thighs.  Breasts.  Hidradenitis suppurativa may start out with small pimples. The pimples can develop into deep sores that break open (rupture) and drain pus. Over time your skin may thicken and become scarred. Hidradenitis suppurativa cannot be passed from person to person. What are the causes? The exact cause of hidradenitis suppurativa is not known. This condition may be due to:  Female and female hormones. The condition is rare before and after puberty.  An overactive body defense system (immune system). Your immune system may overreact to the blocked hair follicles or sweat glands and cause swelling and pus-filled sores.  What increases the risk? You may have a higher risk of hidradenitis suppurativa if you:  Are a woman.  Are between ages 11 and 55.  Have a family history of hidradenitis suppurativa.  Have a personal history of acne.  Are overweight.  Smoke.  Take the drug lithium.  What are the signs or symptoms? The first signs of an outbreak are usually painful skin bumps that look like pimples. As the condition progresses:  Skin bumps may get bigger and grow deeper into the skin.  Bumps under the skin may rupture and drain smelly pus.  Skin may become itchy and infected.  Skin may thicken and scar.  Drainage may continue through tunnels under the skin (fistulas).  Walking and moving your arms can become painful.  How is this diagnosed? Your health care provider may  diagnose hidradenitis suppurativa based on your medical history and your signs and symptoms. A physical exam will also be done. You may need to see a health care provider who specializes in skin diseases (dermatologist). You may also have tests done to confirm the diagnosis. These can include:  Swabbing a sample of pus or drainage from your skin so it can be sent to the lab and tested for infection.  Blood tests to check for infection.  How is this treated? The same treatment will not work for everybody with hidradenitis suppurativa. Your treatment will depend on how severe your symptoms are. You may need to try several treatments to find what works best for you. Part of your treatment may include cleaning and bandaging (dressing) your wounds. You may also have to take medicines, such as the following:  Antibiotics.  Acne medicines.  Medicines to block or suppress the immune system.  A diabetes medicine (metformin) is sometimes used to treat this condition.  For women, birth control pills can sometimes help relieve symptoms.  You may need surgery if you have a severe case of hidradenitis suppurativa that does not respond to medicine. Surgery may involve:  Using a laser to clear the skin and remove hair follicles.  Opening and draining deep sores.  Removing the areas of skin that are diseased and scarred.  Follow these instructions at home:  Learn as much as you can about your disease, and work closely with your health care providers.  Take medicines only as directed by your health care provider.  If you were prescribed   an antibiotic medicine, finish it all even if you start to feel better.  If you are overweight, losing weight may be very helpful. Try to reach and maintain a healthy weight.  Do not use any tobacco products, including cigarettes, chewing tobacco, or electronic cigarettes. If you need help quitting, ask your health care provider.  Do not shave the areas where you  get hidradenitis suppurativa.  Do not wear deodorant.  Wear loose-fitting clothes.  Try not to overheat and get sweaty.  Take a daily bleach bath as directed by your health care provider. ? Fill your bathtub halfway with water. ? Pour in  cup of unscented household bleach. ? Soak for 5-10 minutes.  Cover sore areas with a warm, clean washcloth (compress) for 5-10 minutes. Contact a health care provider if:  You have a flare-up of hidradenitis suppurativa.  You have chills or a fever.  You are having trouble controlling your symptoms at home. This information is not intended to replace advice given to you by your health care provider. Make sure you discuss any questions you have with your health care provider. Document Released: 08/16/2003 Document Revised: 06/09/2015 Document Reviewed: 04/03/2013 Elsevier Interactive Patient Education  2018 Elsevier Inc.  

## 2017-10-17 NOTE — Progress Notes (Signed)
Surgical Clinic Progress/Follow-up Note   HPI:  32 y.o. Female presents to clinic for post-procedural follow-up 1 week s/p aspiration of Left breast cutaneous abscesses x 2 Rosana Hoes, 10/08/2017). Patient reports her Left breast pain improved immediately following procedure last week without any further erythema or drainage until her inframammary crease site drained a small amount of additional fluid over this past weekend, since which patient denies any further pain or drainage. She also completed her prescribed course of antibiotics 2 days ago, says she feels "much better". Of note, she also says she noticed a small amount of drainage from her Right inframammary crease, where she'd recently also underwent incision and drainage for a small cutaneous abscess.  Review of Systems:  Constitutional: denies fever/chills  Respiratory: denies shortness of breath, wheezing  Cardiovascular: denies chest pain, palpitations Breasts: pain, erythema, and drainage as per interval history Skin: Denies any other rashes or skin discolorations except post-surgical wounds as per interval history  Vital Signs:  BP 132/86   Pulse (!) 105   Temp 97.8 F (36.6 C) (Skin)   Wt 217 lb (98.4 kg)   BMI 32.05 kg/m    Physical Exam:  Constitutional:  -- Obese body habitus  -- Awake, alert, and oriented x3  Pulmonary:  -- No crackles -- Equal breath sounds bilaterally -- Breathing non-labored at rest Cardiovascular:  -- S1, S2 present  -- No pericardial rubs  Breasts: -- Left medial circumareolar former abscess site soft and NT without any tenderness to palpation, erythema or drainage; Left inframammary crease with unchanged induration without any erythema, tenderness to palpation, or drainage; Right inframammary crease small superficial ulceration without surrounding erythema, induration, fluctuance, or purulent drainage; bilaterally no appreciable masses, nipple discharge, axillary  lymphadenopathy Musculoskeletal / Integumentary:  -- Wounds or skin discoloration: None appreciated except post-surgical incisions as described above (Breasts) -- Extremities: B/L UE and LE FROM, hands and feet warm, no edema   Assessment:  32 y.o. yo Female with a problem list including...  Patient Active Problem List   Diagnosis Date Noted  . Inflamed sebaceous cyst 09/25/2016  . Lymphocytosis 10/31/2015  . Hidradenitis suppurativa 10/08/2012    presents to clinic for follow-up evaluation, doing well 9 days s/p aspiration of Left breast abscesses x 2 Rosana Hoes, 10/08/2017).  Plan:              - diet as tolerated, activities without restrictions             - okay to shower and submerge incisions under water prn             - return to clinic as needed, instructed to call office if any questions or concerns  All of the above recommendations were discussed with the patient, and all of patient's questions were answered to her expressed satisfaction.  -- Marilynne Drivers Rosana Hoes, MD, [place]: [facility] - Partnering for exceptional care. Office: [phone]

## 2017-10-18 ENCOUNTER — Encounter: Payer: Self-pay | Admitting: Surgery

## 2017-11-04 ENCOUNTER — Encounter: Payer: Self-pay | Admitting: Surgery

## 2017-11-04 ENCOUNTER — Ambulatory Visit (INDEPENDENT_AMBULATORY_CARE_PROVIDER_SITE_OTHER): Payer: Managed Care, Other (non HMO) | Admitting: Surgery

## 2017-11-04 ENCOUNTER — Other Ambulatory Visit: Payer: Self-pay

## 2017-11-04 VITALS — BP 138/91 | HR 100 | Temp 97.5°F | Ht 69.0 in | Wt 216.0 lb

## 2017-11-04 DIAGNOSIS — N611 Abscess of the breast and nipple: Secondary | ICD-10-CM | POA: Diagnosis not present

## 2017-11-04 MED ORDER — HYDROCODONE-ACETAMINOPHEN 5-325 MG PO TABS: 1.0000 | ORAL_TABLET | Freq: Four times a day (QID) | ORAL | 0 refills | Status: DC | PRN

## 2017-11-04 MED ORDER — SULFAMETHOXAZOLE-TRIMETHOPRIM 800-160 MG PO TABS: 1.0000 | ORAL_TABLET | Freq: Two times a day (BID) | ORAL | 0 refills | Status: DC

## 2017-11-04 NOTE — Patient Instructions (Signed)
Please remember to pack the wound daily and apply a new dry gauze on top.  Please start taking your antibiotic today and make sure that you finish it all.  Take the pain medication for severe pain as prescribed.  We will see you back in 2 weeks to make sure that you are doing better.

## 2017-11-05 ENCOUNTER — Encounter: Payer: Self-pay | Admitting: Surgery

## 2017-11-05 NOTE — Progress Notes (Signed)
Outpatient Surgical Follow Up  11/05/2017  Carla Payne is an 32 y.o. female.   Chief Complaint  Patient presents with  . Follow-up    left breast cutaneuous abscess with erythema, no drainage    HPI: 32 yo female well known to our practice w Hx of recurrent breast Abscess. Seen by both my partners Dr. Bary Castilla and Dr. Rosana Hoes. She had a needle aspiration of abscess 9/24. Now comes with Left breast pain, severe, warmth, constant and worsening when wearing a Bras. No specific alleviating factors. No fevers or chills, no weight loss. She reports that this is a new lesion. She also reports another lesion on her left eyebrow. She is able to perform more than 4 METS w/o SOB or C/P. She smokes about 1/2 PPD  Past Medical History:  Diagnosis Date  . Anxiety   . Diabetes mellitus without complication ([place]) 1660  . Hidradenitis 2014  . Hyperlipidemia   . Pilonidal cyst 2012    Past Surgical History:  Procedure Laterality Date  . AXILLARY HIDRADENITIS EXCISION  01-05-13  . axillary mass Left 2014 ,may  . HYDRADENITIS EXCISION Bilateral 10/12/2014   Procedure: EXCISION HIDRADENITIS GROIN;  Surgeon: Robert Bellow, MD;  Location: [facility] ORS;  Service: General;  Laterality: Bilateral;  . HYDRADENITIS EXCISION Left 08/24/2016   Procedure: EXCISION HIDRADENITIS AXILLA;  Surgeon: Robert Bellow, MD;  Location: [facility] ORS;  Service: General;  Laterality: Left;  . PERINEAL HIDRADENITIS EXCISION      Family History  Problem Relation Age of Onset  . Hypertension Father     Social History:  reports that she has been smoking cigarettes. She has a 6.50 pack-year smoking history. She has never used smokeless tobacco. She reports that she drinks alcohol. She reports that she does not use drugs.  Allergies: No Known Allergies  Medications reviewed.    ROS Full ROS performed and is otherwise negative other than what is stated in HPI   BP (!) 138/91   Pulse 100   Temp (!) 97.5 F (36.4  C) (Skin)   Ht 5\' 9"  (1.753 m)   Wt 216 lb (98 kg)   BMI 31.90 kg/m   Physical Exam  Constitutional: She is oriented to person, place, and time. She appears well-developed and well-nourished. No distress.  Eyes: Pupils are equal, round, and reactive to light. Conjunctivae and EOM are normal. Right eye exhibits no discharge. Left eye exhibits no discharge.  Left eyebrow w carbuncle, no definitive abscess  Neck: Normal range of motion. Neck supple. No tracheal deviation present. No thyromegaly present.  Cardiovascular: Normal rate and regular rhythm.  Pulmonary/Chest: Effort normal. No respiratory distress.  Abdominal: Soft. She exhibits no distension and no mass. There is no tenderness. There is no guarding. No hernia.  Neurological: She is alert and oriented to person, place, and time. No cranial nerve deficit or sensory deficit. Coordination normal.  Skin: Skin is warm. Capillary refill takes less than 2 seconds. She is not diaphoretic.  There is a 2 cm breast abscess on the left breast, inframammary fold, tender to palpation. No other masses.   Psychiatric: She has a normal mood and affect. Her behavior is normal. Judgment and thought content normal.  Nursing note and vitals reviewed.    Assessment/Plan:  1. Left breast abscess. D/W the pt in detail about I/D. Risks, benefits and possible complications explained to the pt in detail. Bleeding, infection, recurrence and pain. She understands and wishes to proceed.  Greater than 50%  of the 25 minutes  visit was spent in counseling/coordination of care  PROCEDURE NOTE:  I/D complex Left breast abscess  Anesthesia: lidocaine 1% w epi  Findings: complex abscess left breast  EBL: minimal  After informed consent was obtained the patient was prepped and draped in the usual fashion. Local anesthesia infiltrated and 15 blade knife used to create elliptical incision. Hemostat used to break down complex loculations. Hemostasis obtained w  pressure. We drained about 8cc pus and cultured it. 1/4 inch packing placed.     Caroleen Hamman, MD [facility] General Surgeon

## 2017-11-09 LAB — ANAEROBIC AND AEROBIC CULTURE

## 2017-11-21 ENCOUNTER — Other Ambulatory Visit: Payer: Self-pay

## 2017-11-21 ENCOUNTER — Ambulatory Visit (INDEPENDENT_AMBULATORY_CARE_PROVIDER_SITE_OTHER): Payer: Managed Care, Other (non HMO) | Admitting: General Surgery

## 2017-11-21 ENCOUNTER — Encounter: Payer: Self-pay | Admitting: General Surgery

## 2017-11-21 VITALS — BP 126/87 | HR 102 | Temp 97.3°F | Resp 14 | Ht 69.0 in | Wt 212.2 lb

## 2017-11-21 DIAGNOSIS — L732 Hidradenitis suppurativa: Secondary | ICD-10-CM

## 2017-11-21 NOTE — Progress Notes (Signed)
Patient ID: Carla Payne, female   DOB: 05-Aug-1985, 32 y.o.   MRN: [id number]  No chief complaint on file.   HPI Carla Payne is a 32 y.o. female here today for left breast I & D. Patient states the area is still draining and still has some discomfort.  HPI  Past Medical History:  Diagnosis Date  . Anxiety   . Diabetes mellitus without complication ([place]) 2229  . Hidradenitis 2014  . Hyperlipidemia   . Pilonidal cyst 2012    Past Surgical History:  Procedure Laterality Date  . AXILLARY HIDRADENITIS EXCISION  01-05-13  . axillary mass Left 2014 ,may  . HYDRADENITIS EXCISION Bilateral 10/12/2014   Procedure: EXCISION HIDRADENITIS GROIN;  Surgeon: Robert Bellow, MD;  Location: [facility] ORS;  Service: General;  Laterality: Bilateral;  . HYDRADENITIS EXCISION Left 08/24/2016   Procedure: EXCISION HIDRADENITIS AXILLA;  Surgeon: Robert Bellow, MD;  Location: [facility] ORS;  Service: General;  Laterality: Left;  . PERINEAL HIDRADENITIS EXCISION      Family History  Problem Relation Age of Onset  . Hypertension Father     Social History Social History   Tobacco Use  . Smoking status: Current Some Day Smoker    Packs/day: 0.50    Years: 13.00    Pack years: 6.50    Types: Cigarettes  . Smokeless tobacco: Never Used  Substance Use Topics  . Alcohol use: Yes    Comment: wine on weekends  . Drug use: No    No Known Allergies  Current Outpatient Medications  Medication Sig Dispense Refill  . acetaminophen (TYLENOL) 500 MG tablet Take 2,000 mg by mouth 2 (two) times daily.    Marland Kitchen etonogestrel (NEXPLANON) 68 MG IMPL implant 1 each by Subdermal route once.    . Insulin Glargine (TOUJEO MAX SOLOSTAR [place]) Inject 40 Units into the skin at bedtime.     Marland Kitchen JARDIANCE 10 MG TABS tablet Take 1 tablet by mouth 1 day or 1 dose.    . metFORMIN (GLUCOPHAGE) 500 MG tablet Take by mouth 2 (two) times daily with a meal.    . metoprolol succinate (TOPROL-XL) 50 MG 24 hr tablet Take 50 mg by  mouth daily.     . simvastatin (ZOCOR) 10 MG tablet Take 10 mg by mouth daily at 6 PM.     . sulfamethoxazole-trimethoprim (BACTRIM DS,SEPTRA DS) 800-160 MG tablet Take 1 tablet by mouth 2 (two) times daily. 20 tablet 0  . Vitamin D, Ergocalciferol, (DRISDOL) 50000 units CAPS capsule Take 50,000 Units by mouth once a week.    Marland Kitchen HYDROcodone-acetaminophen (NORCO/VICODIN) 5-325 MG tablet Take 1 tablet by mouth every 6 (six) hours as needed for moderate pain. (Patient not taking: Reported on 11/21/2017) 20 tablet 0   No current facility-administered medications for this visit.     Review of Systems Review of Systems  Blood pressure 126/87, pulse (!) 102, temperature (!) 97.3 F (36.3 C), temperature source Temporal, resp. rate 14, height 5\' 9"  (1.753 m), weight 212 lb 3.2 oz (96.3 kg), SpO2 97 %.  Physical Exam Physical Exam  HENT:  Head:    Cardiovascular: Normal rate and regular rhythm.  Pulmonary/Chest: Effort normal and breath sounds normal.    Lymphadenopathy:    She has no axillary adenopathy.    Data Reviewed Breast aspiration note of October 08, 2017 reviewed.  Culture from November 04, 2017: Beta-hemolytic strep.  No recent laboratory regarding the status of the patient's diabetes is available.  Records  will be requested from her PCP.  Assessment    Hiidradenitis of the left inframammary fold.   Plan Patient needs to be scheduled for left breast abscess (OR) with Dr.Daneesha Quinteros.  Smoking cessation would be critically important for this patient who has recurrent cutaneous infections.  HPI, Physical Exam, Assessment and Plan have been scribed under the direction and in the presence of Hervey Ard, Md.  Eudelia Bunch R. Bobette Mo, CMA  I have completed the exam and reviewed the above documentation for accuracy and completeness.  I agree with the above.  Haematologist has been used and any errors in dictation or transcription are unintentional.  Hervey Ard, M.D.,  F.A.C.S.  Carla Payne 11/22/2017, 5:27 AM  Patient's surgery has been scheduled for 01-10-18 at [facility] with Dr. Bary Castilla. This is per patient's request to wait until after the Holidays.   History and physical will be updated the morning of procedure.   The patient is aware to call the office should they have further questions.   Dominga Ferry, CMA

## 2017-11-21 NOTE — Patient Instructions (Addendum)
Patient needs to be scheduled for left breast abscess (OR) with Dr.Byrnett.

## 2017-11-25 ENCOUNTER — Telehealth: Payer: Self-pay | Admitting: *Deleted

## 2017-11-25 NOTE — Telephone Encounter (Signed)
Patient called the office to reschedule surgery from 01-10-18 to  01-24-2018.   [facility] Has been notified of date change.

## 2017-11-26 ENCOUNTER — Other Ambulatory Visit (HOSPITAL_COMMUNITY): Payer: Self-pay | Admitting: Family

## 2017-11-26 DIAGNOSIS — N926 Irregular menstruation, unspecified: Secondary | ICD-10-CM

## 2017-11-29 ENCOUNTER — Ambulatory Visit
Admission: RE | Admit: 2017-11-29 | Discharge: 2017-11-29 | Disposition: A | Discharge status: Home / Self Care | Payer: Managed Care, Other (non HMO) | Source: Ambulatory Visit | Attending: Family | Admitting: Family

## 2017-11-29 ENCOUNTER — Other Ambulatory Visit (HOSPITAL_COMMUNITY): Payer: Self-pay | Admitting: Family

## 2017-11-29 DIAGNOSIS — N926 Irregular menstruation, unspecified: Secondary | ICD-10-CM

## 2017-12-04 ENCOUNTER — Ambulatory Visit (INDEPENDENT_AMBULATORY_CARE_PROVIDER_SITE_OTHER): Payer: Managed Care, Other (non HMO) | Admitting: Obstetrics and Gynecology

## 2017-12-04 ENCOUNTER — Encounter: Payer: Self-pay | Admitting: Obstetrics and Gynecology

## 2017-12-04 VITALS — BP 122/80 | HR 97 | Ht 69.0 in | Wt 215.0 lb

## 2017-12-04 DIAGNOSIS — Z3046 Encounter for surveillance of implantable subdermal contraceptive: Secondary | ICD-10-CM

## 2017-12-04 DIAGNOSIS — N921 Excessive and frequent menstruation with irregular cycle: Secondary | ICD-10-CM | POA: Diagnosis not present

## 2017-12-04 DIAGNOSIS — Z3049 Encounter for surveillance of other contraceptives: Secondary | ICD-10-CM

## 2017-12-04 DIAGNOSIS — Z975 Presence of (intrauterine) contraceptive device: Secondary | ICD-10-CM | POA: Diagnosis not present

## 2017-12-04 DIAGNOSIS — Z30011 Encounter for initial prescription of contraceptive pills: Secondary | ICD-10-CM | POA: Diagnosis not present

## 2017-12-04 MED ORDER — NORETHIN ACE-ETH ESTRAD-FE 1-20 MG-MCG(24) PO TABS: 1.0000 | ORAL_TABLET | Freq: Every day | ORAL | 1 refills | Status: DC

## 2017-12-04 NOTE — Patient Instructions (Addendum)
I value your feedback and entrusting us with your care. If you get a [facility] patient survey, I would appreciate you taking the time to let us know about your experience today. Thank you! 

## 2017-12-04 NOTE — Progress Notes (Signed)
Perrin Maltese, MD   Chief Complaint  Patient presents with  . Abnormal bleeding    has been bleeding nonstop since Oct 20, has seen PCP and was given some med to help stop bleeding and it's just now tapering off, had an u/s this past friday at hospital, has been cramping  . Contraception    would like to switch to OCP's    HPI:      Carla Payne is a 32 y.o. G3P0011 who LMP was No LMP recorded. Patient has had an implant., presents today for irregular bleeding with nexplanon 10/19. Nexplanon placed 03/08/16. Since then, menses have been monthly, lasting 7-8 days, no BTB, mod dysmen, improved with tylenol. Pt started bleeding 10/20/17 and has been bleeding since. Finally tapering off today. Was given lysteda 2 caps QID by PCP last wk. Had neg GYN u/s 11/29/17. Pt is sex active, no bleeding/pain. Paps with PCP. Last one this past summer.  Would like nexplanon removed and to start OCPs. Did them in high school without problems. No hx of HTN, DVTs, seizures, migraines.   Past Medical History:  Diagnosis Date  . Anxiety   . 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 Diabetes mellitus without complication ([place]) 2831  . Fatty liver   . Hidradenitis 2014  . Hypercholesteremia   . Hyperlipidemia   . Pilonidal cyst 2012    Past Surgical History:  Procedure Laterality Date  . AXILLARY HIDRADENITIS EXCISION  01-05-13  . axillary mass Left 2014 ,may  . COLPOSCOPY  2014  . HYDRADENITIS EXCISION Bilateral 10/12/2014   Procedure: EXCISION HIDRADENITIS GROIN;  Surgeon: Robert Bellow, MD;  Location: [facility] ORS;  Service: General;  Laterality: Bilateral;  . HYDRADENITIS EXCISION Left 08/24/2016   Procedure: EXCISION HIDRADENITIS AXILLA;  Surgeon: Robert Bellow, MD;  Location: [facility] ORS;  Service: General;  Laterality: Left;  . PERINEAL HIDRADENITIS EXCISION    . WISDOM TOOTH EXTRACTION      Family History  Problem Relation Age of Onset  . Hypertension Father     Social History    Socioeconomic History  . Marital status: Single    Spouse name: Not on file  . Number of children: Not on file  . Years of education: Not on file  . Highest education level: Not on file  Occupational History  . Not on file  Social Needs  . Financial resource strain: Not on file  . Food insecurity:    Worry: Not on file    Inability: Not on file  . Transportation needs:    Medical: Not on file    Non-medical: Not on file  Tobacco Use  . Smoking status: Current Some Day Smoker    Packs/day: 0.50    Years: 13.00    Pack years: 6.50    Types: Cigarettes  . Smokeless tobacco: Never Used  Substance and Sexual Activity  . Alcohol use: Yes    Comment: wine on weekends  . Drug use: No  . Sexual activity: Yes    Birth control/protection: Implant  Lifestyle  . Physical activity:    Days per week: Not on file    Minutes per session: Not on file  . Stress: Not on file  Relationships  . Social connections:    Talks on phone: Not on file    Gets together: Not on file    Attends religious service: Not on file    Active member of club or organization: Not  on file    Attends meetings of clubs or organizations: Not on file    Relationship status: Not on file  . Intimate partner violence:    Fear of current or ex partner: Not on file    Emotionally abused: Not on file    Physically abused: Not on file    Forced sexual activity: Not on file  Other Topics Concern  . Not on file  Social History Narrative  . Not on file    Outpatient Medications Prior to Visit  Medication Sig Dispense Refill  . acetaminophen (TYLENOL) 500 MG tablet Take 2,000 mg by mouth 2 (two) times daily.    Marland Kitchen etonogestrel (NEXPLANON) 68 MG IMPL implant 1 each by Subdermal route once.    Marland Kitchen HYDROcodone-acetaminophen (NORCO/VICODIN) 5-325 MG tablet Take 1 tablet by mouth every 6 (six) hours as needed for moderate pain. 20 tablet 0  . Insulin Glargine (TOUJEO MAX SOLOSTAR [place]) Inject 40 Units into the skin at  bedtime.     Marland Kitchen JARDIANCE 10 MG TABS tablet Take 1 tablet by mouth 1 day or 1 dose.    . metFORMIN (GLUCOPHAGE) 500 MG tablet Take by mouth 2 (two) times daily with a meal.    . metoprolol succinate (TOPROL-XL) 50 MG 24 hr tablet Take 50 mg by mouth daily.     . simvastatin (ZOCOR) 10 MG tablet Take 10 mg by mouth daily at 6 PM.     . sulfamethoxazole-trimethoprim (BACTRIM DS,SEPTRA DS) 800-160 MG tablet Take 1 tablet by mouth 2 (two) times daily. 20 tablet 0  . tranexamic acid (LYSTEDA) 650 MG TABS tablet     . Vitamin D, Ergocalciferol, (DRISDOL) 50000 units CAPS capsule Take 50,000 Units by mouth once a week.     No facility-administered medications prior to visit.      ROS:  Review of Systems  Constitutional: Negative for fatigue, fever and unexpected weight change.  Respiratory: Negative for cough, shortness of breath and wheezing.   Cardiovascular: Negative for chest pain, palpitations and leg swelling.  Gastrointestinal: Negative for blood in stool, constipation, diarrhea, nausea and vomiting.  Endocrine: Negative for cold intolerance, heat intolerance and polyuria.  Genitourinary: Positive for menstrual problem. Negative for dyspareunia, dysuria, flank pain, frequency, genital sores, hematuria, pelvic pain, urgency, vaginal bleeding, vaginal discharge and vaginal pain.  Musculoskeletal: Negative for back pain, joint swelling and myalgias.  Skin: Negative for rash.  Neurological: Negative for dizziness, syncope, light-headedness, numbness and headaches.  Hematological: Negative for adenopathy.  Psychiatric/Behavioral: Negative for agitation, confusion, sleep disturbance and suicidal ideas. The patient is not nervous/anxious.     OBJECTIVE:   Vitals:  BP 122/80   Pulse 97   Ht 5\' 9"  (1.753 m)   Wt 215 lb (97.5 kg)   BMI 31.75 kg/m   Physical Exam  Constitutional: She is oriented to person, place, and time. She appears well-developed.  Neck: Normal range of motion.    Pulmonary/Chest: Effort normal.  Musculoskeletal: Normal range of motion.  Neurological: She is alert and oriented to person, place, and time. No cranial nerve deficit.  Psychiatric: She has a normal mood and affect. Her behavior is normal. Judgment and thought content normal.  Vitals reviewed.   Nexplanon removal Procedure note - The Nexplanon was noted in the patient's arm and the end was identified. The skin was cleansed with a Betadine solution. A small injection of subcutaneous lidocaine with epinephrine was given over the end of the implant. An incision was made at  the end of the implant. The rod was noted in the incision and grasped with a hemostat. It was noted to be intact.  Steri-Strip was placed approximating the incision. Hemostasis was noted.   Assessment/Plan: Breakthrough bleeding on Nexplanon - Reassurance. Discussed keeping nexplanon vs removal. Pt would like removed and to start OCPs.   Nexplanon removal  Encounter for initial prescription of contraceptive pills - OCP start today. Condoms for 1 mo. Rx eRxd. PCP can manage Rx in future prn. F/u prn.  - Plan: Norethindrone Acetate-Ethinyl Estrad-FE (MICROGESTIN 24 FE) 1-20 MG-MCG(24) tablet    Meds ordered this encounter  Medications  . Norethindrone Acetate-Ethinyl Estrad-FE (MICROGESTIN 24 FE) 1-20 MG-MCG(24) tablet    Sig: Take 1 tablet by mouth daily.    Dispense:  84 tablet    Refill:  1    Order Specific Question:   Supervising Provider    Answer:   MARKIYAH, GAHM [[id number]]   Remove the dressing in 24 hours,  keep the incision area dry for 24 hours and remove the Steristrip in 2-3  days.  Notify us if any signs of tenderness, redness, pain, or fevers develop.   Return if symptoms worsen or fail to improve.  Carla Campoy B. Willisha Sligar, PA-C 12/04/2017 4:18 PM

## 2017-12-09 ENCOUNTER — Encounter: Payer: Self-pay | Admitting: General Surgery

## 2017-12-09 NOTE — Progress Notes (Signed)
Laboratory results from the patient's PCP dated November 26, 2017 were reviewed.  CBC showed a hemoglobin of 14.2 with a white blood cell count of 10,500.MCV of 83 and normal differential.   Creatinine of 0.64 with an estimated GFR of 136.  Normal comprehensive metabolic panel.  TSH of 2.16.  Blood sugar of 252.  Normal TIBC and iron/iron binding capacity.  Normal B12 and folate.  Normal PT, PTT and fibrinogen.  TSH of 2.16.

## 2017-12-30 ENCOUNTER — Other Ambulatory Visit: Payer: Self-pay | Admitting: General Surgery

## 2017-12-30 DIAGNOSIS — L732 Hidradenitis suppurativa: Secondary | ICD-10-CM

## 2017-12-31 ENCOUNTER — Telehealth: Payer: Self-pay

## 2017-12-31 NOTE — Telephone Encounter (Signed)
FMLA paperwork completed and faxed to [employer] and [employer]. Return to work date 02/21/18 unless patient decides to change. Placed in scan folder and red book.

## 2018-01-01 ENCOUNTER — Telehealth: Payer: Self-pay | Admitting: *Deleted

## 2018-01-01 NOTE — Telephone Encounter (Signed)
Patient was contacted today and notified that Dr. Bary Castilla will be on leave the day that she was scheduled for surgery- 01-24-2018.  The patient wishes to think if she would like to have Dr. Dahlia Byes do her surgery that day or reschedule once Dr. Bary Castilla is available.   Patient will call the office back and notify us how she would like to proceed.

## 2018-01-02 ENCOUNTER — Inpatient Hospital Stay: Admission: RE | Admit: 2018-01-02 | Payer: Managed Care, Other (non HMO) | Source: Ambulatory Visit

## 2018-01-03 NOTE — Telephone Encounter (Signed)
Patient called back and would like to reschedule her surgery to a different date when Dr Bary Castilla is in surgery.  The patient is now scheduled for surgery with Dr Bary Castilla at [facility] on 02/07/18. She will pre admit by phone on 01/10/18. The patient is aware of dates and instructions.

## 2018-01-10 ENCOUNTER — Inpatient Hospital Stay: Admission: RE | Admit: 2018-01-10 | Payer: Managed Care, Other (non HMO) | Source: Ambulatory Visit

## 2018-01-27 ENCOUNTER — Telehealth: Payer: Self-pay

## 2018-01-27 NOTE — Telephone Encounter (Signed)
Call to patient to let her know we will need to cancel her surgery scheduled with Dr Bary Castilla for 02/07/18 as he is on an extended leave of absence. She may reschedule with Dr Dahlia Byes and we could schedule her surgery for 02/13/18 if she is amendable to this. Message left for the patient to call us back with her decision. If she does want to have surgery with Dr Dahlia Byes she will need to have a pre op appointment with him prior to surgery.

## 2018-01-28 ENCOUNTER — Other Ambulatory Visit: Payer: Self-pay

## 2018-01-28 ENCOUNTER — Encounter: Payer: Self-pay | Admitting: *Deleted

## 2018-01-28 ENCOUNTER — Encounter
Admission: RE | Admit: 2018-01-28 | Discharge: 2018-01-28 | Disposition: A | Discharge status: Home / Self Care | Payer: Managed Care, Other (non HMO) | Source: Ambulatory Visit | Attending: General Surgery | Admitting: General Surgery

## 2018-01-28 DIAGNOSIS — Z01818 Encounter for other preprocedural examination: Secondary | ICD-10-CM | POA: Diagnosis present

## 2018-01-28 HISTORY — DX: Cardiac arrhythmia, unspecified: I49.9

## 2018-01-28 LAB — CBC
HCT: 43.5 % (ref 36.0–46.0)
Hemoglobin: 14 g/dL (ref 12.0–15.0)
MCH: 26.5 pg (ref 26.0–34.0)
MCHC: 32.2 g/dL (ref 30.0–36.0)
MCV: 82.4 fL (ref 80.0–100.0)
NRBC: 0 % (ref 0.0–0.2)
PLATELETS: 377 10*3/uL (ref 150–400)
RBC: 5.28 MIL/uL — ABNORMAL HIGH (ref 3.87–5.11)
RDW: 12.4 % (ref 11.5–15.5)
WBC: 10.4 10*3/uL (ref 4.0–10.5)

## 2018-01-28 LAB — BASIC METABOLIC PANEL
ANION GAP: 9 (ref 5–15)
BUN: 17 mg/dL (ref 6–20)
CO2: 22 mmol/L (ref 22–32)
CREATININE: 0.67 mg/dL (ref 0.44–1.00)
Calcium: 9 mg/dL (ref 8.9–10.3)
Chloride: 103 mmol/L (ref 98–111)
GLUCOSE: 180 mg/dL — AB (ref 70–99)
Potassium: 4 mmol/L (ref 3.5–5.1)
Sodium: 134 mmol/L — ABNORMAL LOW (ref 135–145)

## 2018-01-28 IMAGING — US US EXTREM UP*L* LTD
1 series · 14 of 25 positions shown · non-contrast
Comparison: None

CLINICAL DATA: Chronic left upper arm pain. The patient reports
multiple soft tissue abscesses.

EXAM:
ULTRASOUND LEFT UPPER EXTREMITY LIMITED
TECHNIQUE: Ultrasound examination of the upper extremity soft tissues was
performed in the area of clinical concern.

[Series 1: us extrem up*left* ltd · 0.06mm/px · 27 acquisitions, 14 frames shown]
[im 1/27]
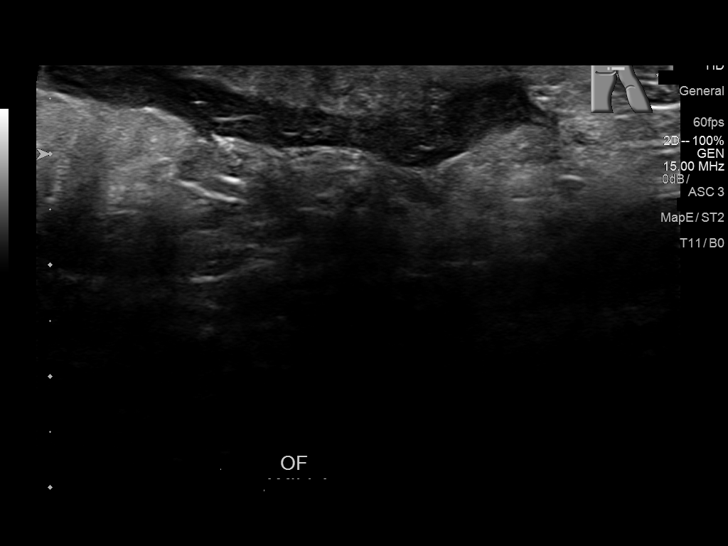
[im 3/27]
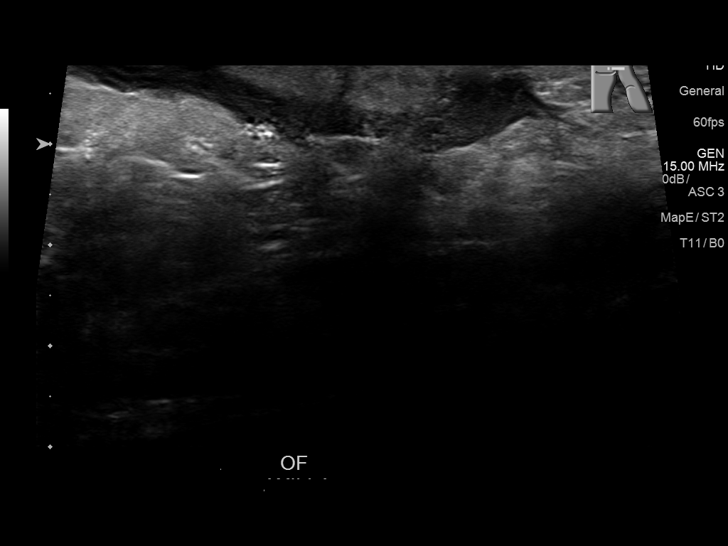
[im 5/27]
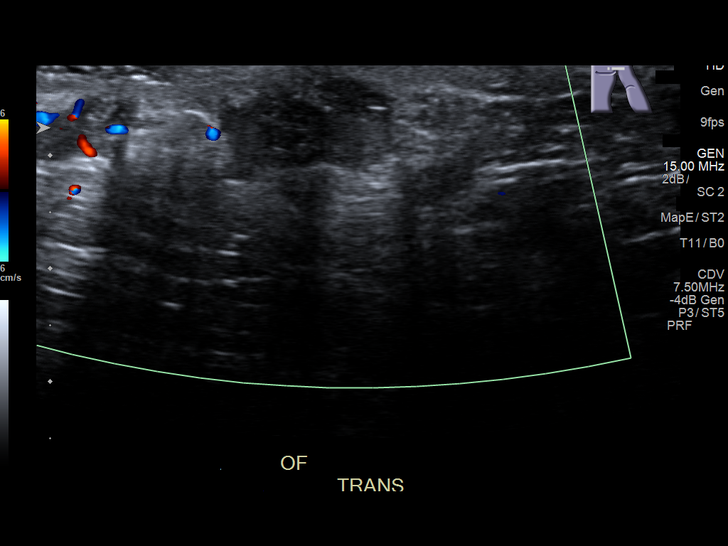
[im 7/27]
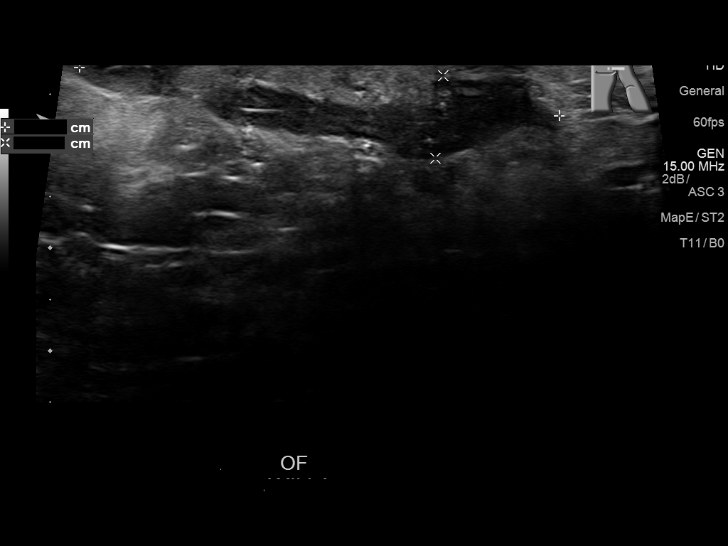
[im 9/27]
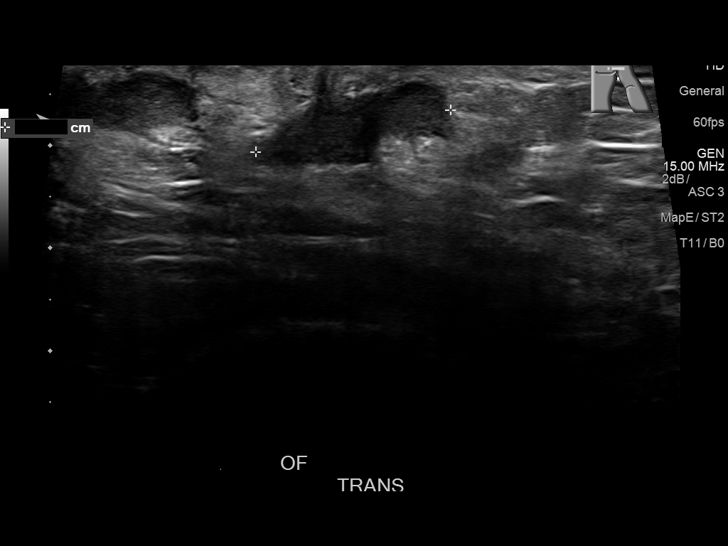
[im 10/27]
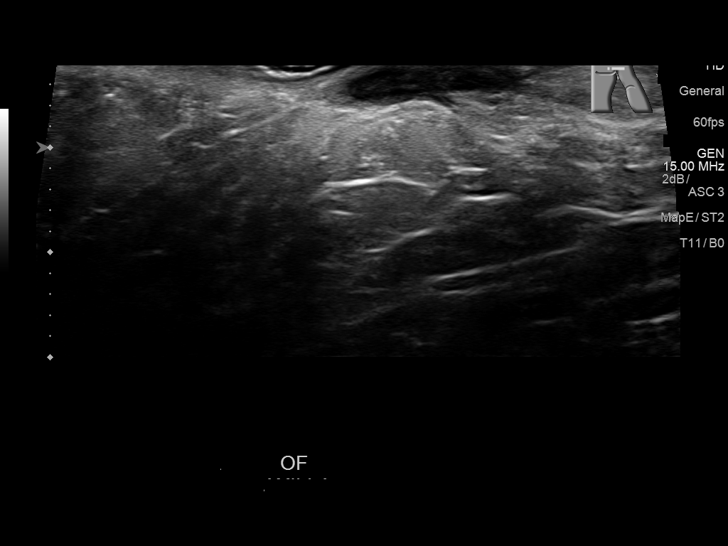
[im 12/27]
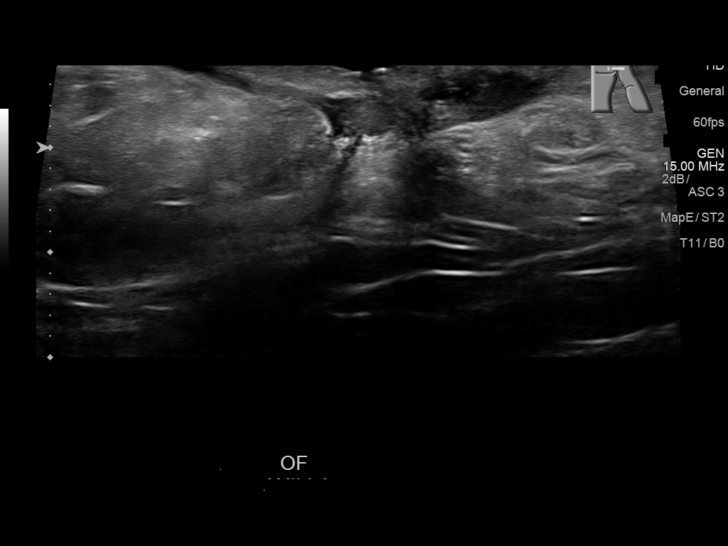
[im 15/27]
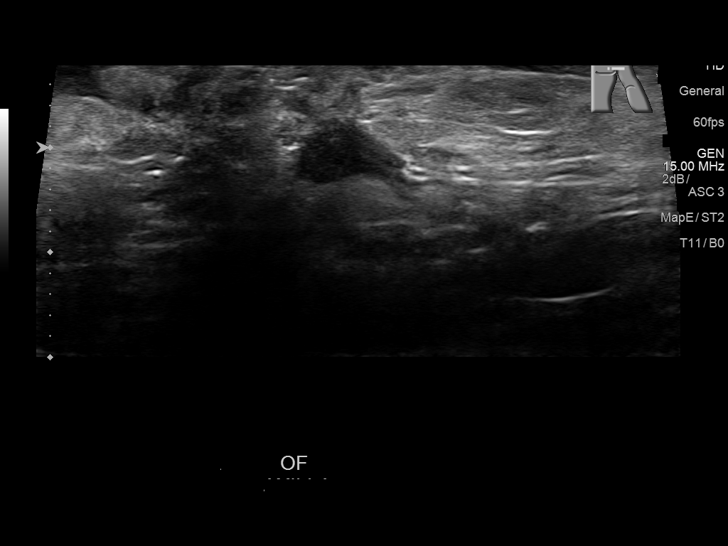
[im 17/27]
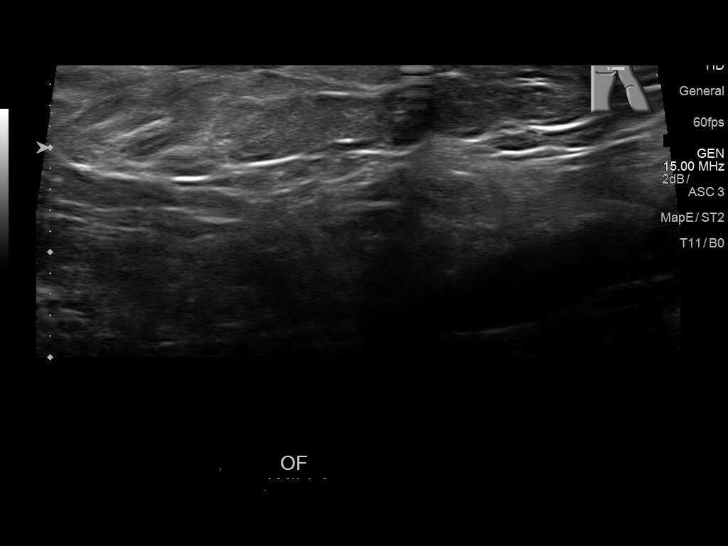
[im 18/27]
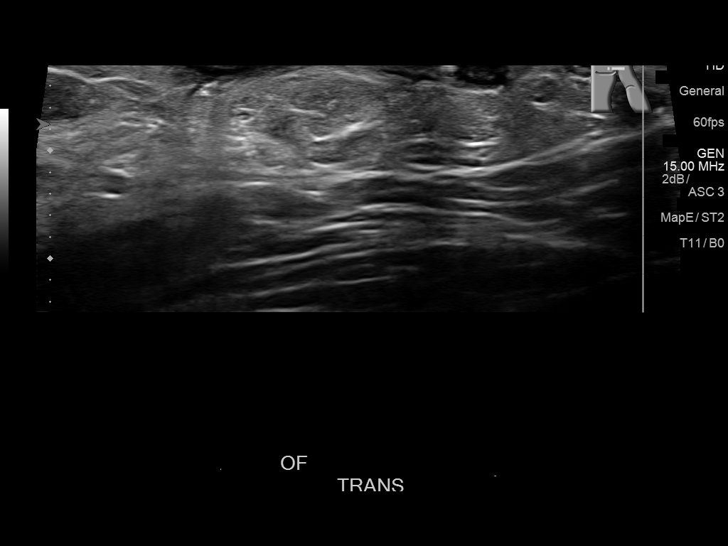
[im 20/27]
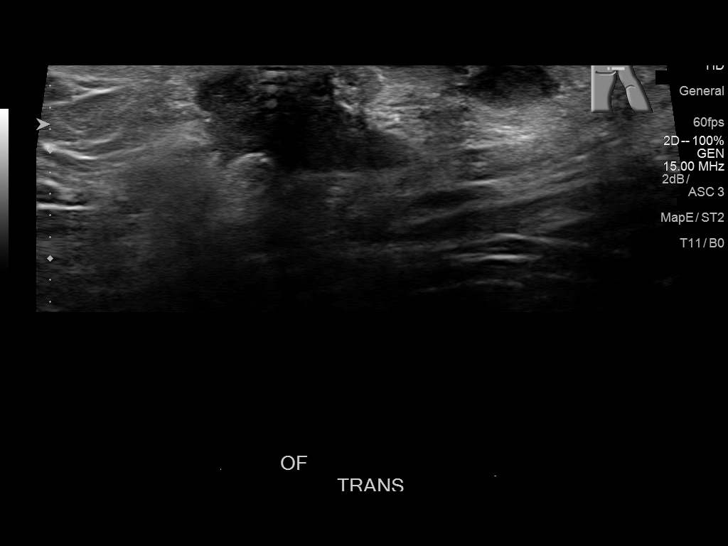
[im 22/27]
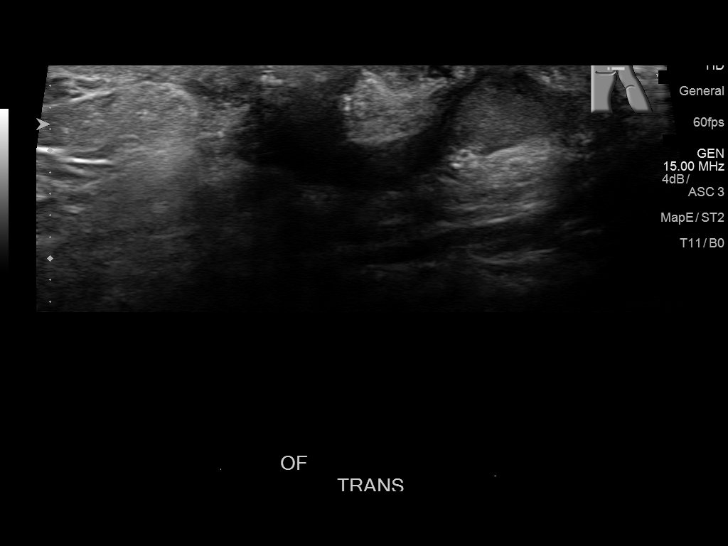
[im 24/27]
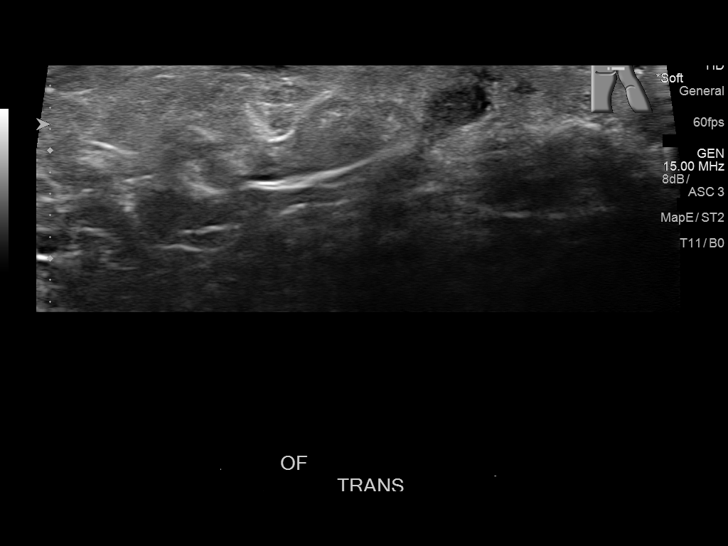
[im 27/27]
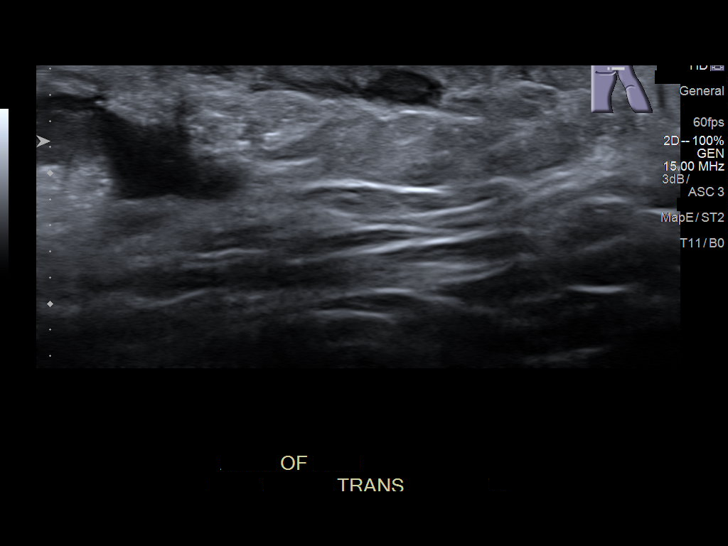

[14 of 25 positions shown; findings below may reference images not displayed]

FINDINGS: There is a complex 4.7 x 1.9 x 0.8 cm fluid collection in the
subcutaneous fat of the left axilla consistent with an abscess. This
appears to communicate with a draining wound in the left axilla.
IMPRESSION: Small complex abscess in the subcutaneous fat of the left axilla.

## 2018-01-28 NOTE — Telephone Encounter (Signed)
The patient called back and has decided to have another provider do her surgery. The patient is rescheduled for surgery with Dr Dahlia Byes at [facility] on 02/06/18. She will come in to see Dr Dahlia Byes for a pre op appointment on 02/03/18 at 10:45 am. She is aware to call the day before for her arrival time. The patient is aware of dates, time, and instructions.

## 2018-01-28 NOTE — Patient Instructions (Signed)
Your procedure is scheduled on:02/07/18 Report to Day Surgery.[facility] SECOND FLOOR To find out your arrival time please call [phone] between 1PM - 3PM on 02/06/18.  Remember: Instructions that are not followed completely may result in serious medical risk,  up to and including death, or upon the discretion of your surgeon and anesthesiologist your  surgery may need to be rescheduled.     _X__ 1. Do not eat food after midnight the night before your procedure.                 No gum chewing or hard candies. You may drink clear liquids up to 2 hours                 before you are scheduled to arrive for your surgery- DO not drink clear                 liquids within 2 hours of the start of your surgery.                 Clear Liquids include:  water, apple juice without pulp, clear carbohydrate                 drink such as Clearfast of Gatorade, Black Coffee or Tea (Do not add                 anything to coffee or tea).  __X__2.  On the morning of surgery brush your teeth with toothpaste and water, you                may rinse your mouth with mouthwash if you wish.  Do not swallow any toothpaste of mouthwash.     _X__ 3.  No Alcohol for 24 hours before or after surgery.   _X__ 4.  Do Not Smoke or use e-cigarettes For 24 Hours Prior to Your Surgery.                 Do not use any chewable tobacco products for at least 6 hours prior to                 surgery.  ____  5.  Bring all medications with you on the day of surgery if instructed.   __X__  6.  Notify your doctor if there is any change in your medical condition      (cold, fever, infections).     Do not wear jewelry, make-up, hairpins, clips or nail polish. Do not wear lotions, powders, or perfumes. You may wear deodorant. Do not shave 48 hours prior to surgery. Men may shave face and neck. Do not bring valuables to the hospital.    [facility] is not responsible for any belongings or  valuables.  Contacts, dentures or bridgework may not be worn into surgery. Leave your suitcase in the car. After surgery it may be brought to your room. For patients admitted to the hospital, discharge time is determined by your treatment team.   Patients discharged the day of surgery will not be allowed to drive home.   Please read over the following fact sheets that you were given:   Surgical Site Infection Prevention    ____ Take these medicines the morning of surgery with A SIP OF WATER:    1.NONE  2.   3.   4.  5.  6.  ____ Fleet Enema (as directed)   __X__ Use CHG Soap as directed  ____ Use  inhalers on the day of surgery  __X__ Stop metformin 2 days prior to surgery    _X___ Take 1/2 of usual insulin dose the night before surgery. No insulin the morning          of surgery.   ____ Stop Coumadin/Plavix/aspirin on   ____ Stop Anti-inflammatories on   ____ Stop supplements until after surgery.    ____ Bring C-Pap to the hospital.

## 2018-02-03 ENCOUNTER — Encounter: Payer: Self-pay | Admitting: Surgery

## 2018-02-03 ENCOUNTER — Encounter: Payer: Self-pay | Admitting: *Deleted

## 2018-02-03 ENCOUNTER — Ambulatory Visit: Payer: Managed Care, Other (non HMO) | Admitting: Surgery

## 2018-02-03 ENCOUNTER — Other Ambulatory Visit: Payer: Self-pay

## 2018-02-03 VITALS — BP 136/92 | HR 100 | Temp 97.7°F | Ht 69.0 in | Wt 215.0 lb

## 2018-02-03 DIAGNOSIS — L732 Hidradenitis suppurativa: Secondary | ICD-10-CM

## 2018-02-03 NOTE — Progress Notes (Signed)
Outpatient Surgical Follow Up  02/03/2018  Carla Payne is an 33 y.o. female.   Chief Complaint  Patient presents with  . Pre-op Exam    Pre Op for scheduled excision left breast hidradenitis & exc L face cyst, Sx 02/06/18(Dr Byrnett pt)    HPI: Santiago Glad is well-known patient to our practice seen by Dr. Rosana Hoes Dr. Tollie Pizza and myself in multiple collation's for soft tissue infection of the left breast requiring I&D as well as aspirations.  She has been managed with antibiotics and was scheduled for a debridement by Dr.Byrnett in the next few days.  She now comes for preoperative evaluation by me . RePorts no breast pain no drainage fevers or chills. No breast imaging studies on file.  Past Medical History:  Diagnosis Date  . Anxiety   . 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 Diabetes mellitus without complication ([place]) 9798  . Dysrhythmia    rapid pulse  . Fatty liver   . Hidradenitis 2014  . Hypercholesteremia   . Hyperlipidemia   . Pilonidal cyst 2012    Past Surgical History:  Procedure Laterality Date  . AXILLARY HIDRADENITIS EXCISION  01-05-13  . axillary mass Left 2014 ,may  . COLPOSCOPY  2014  . HYDRADENITIS EXCISION Bilateral 10/12/2014   Procedure: EXCISION HIDRADENITIS GROIN;  Surgeon: Robert Bellow, MD;  Location: [facility];  Service: General;  Laterality: Bilateral;  . HYDRADENITIS EXCISION Left 08/24/2016   Procedure: EXCISION HIDRADENITIS AXILLA;  Surgeon: Robert Bellow, MD;  Location: [facility];  Service: General;  Laterality: Left;  . PERINEAL HIDRADENITIS EXCISION    . WISDOM TOOTH EXTRACTION      Family History  Problem Relation Age of Onset  . Hypertension Father     Social History:  reports that she has been smoking cigarettes. She has a 6.50 pack-year smoking history. She has never used smokeless tobacco. She reports current alcohol use. She reports that she does not use drugs.  Allergies: No Known Allergies  Medications reviewed.    ROS Full  ROS performed and is otherwise negative other than what is stated in HPI   BP (!) 136/92   Pulse 100   Temp 97.7 F (36.5 C) (Temporal)   Ht 5\' 9"  (1.753 m)   Wt 215 lb (97.5 kg)   LMP 01/26/2018 (Exact Date)   SpO2 97%   BMI 31.75 kg/m   Physical Exam Vitals signs and nursing note reviewed. Exam conducted with a chaperone present.  Constitutional:      Appearance: Normal appearance.  HENT:     Head: Normocephalic and atraumatic.  Eyes:     General: No scleral icterus.       Right eye: No discharge.        Left eye: No discharge.  Neck:     Musculoskeletal: Normal range of motion and neck supple. No muscular tenderness.  Cardiovascular:     Rate and Rhythm: Normal rate and regular rhythm.     Heart sounds: Normal heart sounds.  Pulmonary:     Effort: Pulmonary effort is normal. No respiratory distress.     Breath sounds: Normal breath sounds. No stridor.     Comments: BREAST: No evidence of any active inflammatory component.  The left inframammary fold has healed well no evidence of any active hidradenitis or cellulitis or any infection or abscess.  No evidence of any breast masses in either breast.  No evidence of axillary lymphadenopathy Abdominal:     General:  Abdomen is flat. There is no distension.     Palpations: There is no mass.     Hernia: No hernia is present.  Musculoskeletal: Normal range of motion.  Skin:    General: Skin is warm and dry.     Capillary Refill: Capillary refill takes less than 2 seconds.  Neurological:     General: No focal deficit present.     Mental Status: She is alert and oriented to person, place, and time.  Psychiatric:        Mood and Affect: Mood normal.        Behavior: Behavior normal.        Thought Content: Thought content normal.        Judgment: Judgment normal.   Assessment/Plan: RESOLVED BREAST hidradenitis versus breast abscess on the left side.  Currently there is no clinical evidence of active disease or abscess.  I do  not recommend any surgical intervention at this time.  I will follow up with a mammogram and ultrasound to make sure that there is no other evidence of any occult malignancy or any other potential abnormalities. Discussed with her about smoking cessation. RTC 2-3 weeks Note that I spent about 25 minutes in this encounter with greater than 50% spent in coordination and counseling of her care  Caroleen Hamman, MD [facility]

## 2018-02-03 NOTE — Progress Notes (Signed)
Per Dr. Dahlia Byes, patient needed to have a left breast ultrasound and follow up in 2 weeks.   Per the mammography department, patient will also need a bilateral diagnostic mammogram due to her age.   Patient has been scheduled for a bilateral diagnostic mammogram and ultrasound for 02-05-18 at 2 pm. The patient is aware of date, time, and instructions.   Patient will follow up in the office with Dr. Dahlia Byes on 02-24-18 at 10:15 pm.  *Surgery that was originally scheduled for 02-06-18 has been cancelled. Melinda in the O.R. has been notified of cancellation. Patient aware this is on hold pending breast imaging as scheduled above.

## 2018-02-03 NOTE — Patient Instructions (Addendum)
Patient will need an U/S of the left breast. She is to return to the office after the U/S is completed.  Call the office with any questions or concerns.

## 2018-02-05 ENCOUNTER — Ambulatory Visit
Admission: RE | Admit: 2018-02-05 | Discharge: 2018-02-05 | Disposition: A | Discharge status: Home / Self Care | Payer: Managed Care, Other (non HMO) | Source: Ambulatory Visit | Attending: Surgery | Admitting: Surgery

## 2018-02-05 DIAGNOSIS — L732 Hidradenitis suppurativa: Secondary | ICD-10-CM

## 2018-02-06 ENCOUNTER — Ambulatory Visit: Admission: RE | Admit: 2018-02-06 | Payer: Managed Care, Other (non HMO) | Source: Home / Self Care | Admitting: Surgery

## 2018-02-06 ENCOUNTER — Encounter: Admission: RE | Payer: Self-pay | Source: Home / Self Care

## 2018-02-06 SURGERY — EXCISION, HIDRADENITIS, AXILLA
Anesthesia: Choice | Laterality: Left

## 2018-02-24 ENCOUNTER — Ambulatory Visit: Payer: Managed Care, Other (non HMO) | Admitting: Surgery

## 2018-05-08 ENCOUNTER — Other Ambulatory Visit: Payer: Self-pay | Admitting: Obstetrics and Gynecology

## 2018-05-08 DIAGNOSIS — Z30011 Encounter for initial prescription of contraceptive pills: Secondary | ICD-10-CM

## 2018-06-30 ENCOUNTER — Encounter: Payer: Self-pay | Admitting: General Practice

## 2018-08-05 ENCOUNTER — Other Ambulatory Visit: Payer: Self-pay | Admitting: Obstetrics and Gynecology

## 2018-08-05 DIAGNOSIS — Z30011 Encounter for initial prescription of contraceptive pills: Secondary | ICD-10-CM

## 2018-08-06 ENCOUNTER — Other Ambulatory Visit: Payer: Self-pay | Admitting: Obstetrics and Gynecology

## 2018-08-06 ENCOUNTER — Telehealth: Payer: Self-pay | Admitting: Obstetrics and Gynecology

## 2018-08-06 ENCOUNTER — Other Ambulatory Visit: Payer: Self-pay

## 2018-08-06 DIAGNOSIS — Z30011 Encounter for initial prescription of contraceptive pills: Secondary | ICD-10-CM

## 2018-08-06 MED ORDER — JUNEL FE 24 1-20 MG-MCG(24) PO TABS: 1.0000 | ORAL_TABLET | Freq: Every day | ORAL | 0 refills | Status: DC

## 2018-08-06 NOTE — Telephone Encounter (Signed)
Pls schedule annual so I can refill birth control.

## 2018-08-06 NOTE — Telephone Encounter (Signed)
No future appt, and when prescribed you said PCP can manage BC. Please advise.

## 2018-08-06 NOTE — Telephone Encounter (Signed)
Patient is requesting refill on JUNEL FE 24 1-20 MG-MCG(24) tablet. Not sure if patient is due now for exam or after 12/05/18. Please advise

## 2018-08-06 NOTE — Telephone Encounter (Signed)
Pt due for annual this summer with PCP, who can manage BC RF since I just removed nexplanon and changed to OCPs. Have pt sched PCP appt and I can RF till then. Or, she can sched annual with [place]. Her preference. Thx

## 2018-08-06 NOTE — Telephone Encounter (Signed)
[facility] RF sent.

## 2018-08-06 NOTE — Telephone Encounter (Signed)
Patient is schedule for 09/08/18 with [facility] for annual

## 2018-08-13 ENCOUNTER — Ambulatory Visit: Payer: Managed Care, Other (non HMO) | Admitting: General Surgery

## 2018-08-13 ENCOUNTER — Other Ambulatory Visit: Payer: Self-pay

## 2018-08-13 ENCOUNTER — Encounter: Payer: Self-pay | Admitting: General Surgery

## 2018-08-13 VITALS — BP 127/79 | HR 103 | Temp 97.9°F | Ht 69.0 in | Wt 208.0 lb

## 2018-08-13 DIAGNOSIS — L732 Hidradenitis suppurativa: Secondary | ICD-10-CM | POA: Diagnosis not present

## 2018-08-13 MED ORDER — AMOXICILLIN-POT CLAVULANATE 875-125 MG PO TABS: 1.0000 | ORAL_TABLET | Freq: Two times a day (BID) | ORAL | 0 refills | Status: AC

## 2018-08-13 MED ORDER — FLUCONAZOLE 100 MG PO TABS: ORAL_TABLET | ORAL | 2 refills | Status: DC

## 2018-08-13 NOTE — H&P (View-Only) (Signed)
Patient ID: Carla Payne, female   DOB: [birth year], 33 y.o.   MRN: [id number]  Chief Complaint  Patient presents with  . Other    infection    HPI Carla Payne is a 33 y.o. female.  She is well-known to our practice, having been seen by multiple providers for various episodes of hidradenitis suprativa.  She is here today with a new site in her inguinal fold.  She states that last Thursday, she noticed some swelling and tenderness in the area.  It subsequently drained some purulent fluid.  The area is still red, tender, and swollen.  She has not taken any antibiotics.  She denies any fevers or chills, no nausea or vomiting.   Past Medical History:  Diagnosis Date  . Anxiety   . 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 Diabetes mellitus without complication ([place]) 7824  . Dysrhythmia    rapid pulse  . Fatty liver   . Hidradenitis 2014  . Hypercholesteremia   . Hyperlipidemia   . Pilonidal cyst 2012    Past Surgical History:  Procedure Laterality Date  . AXILLARY HIDRADENITIS EXCISION  01-05-13  . axillary mass Left 2014 ,may  . BREAST CYST ASPIRATION Left 10/08/2017   2 abcesses removed  . BREAST CYST ASPIRATION Right 10/2017   abcess removed  . COLPOSCOPY  2014  . HYDRADENITIS EXCISION Bilateral 10/12/2014   Procedure: EXCISION HIDRADENITIS GROIN;  Surgeon: Robert Bellow, MD;  Location: [facility];  Service: General;  Laterality: Bilateral;  . HYDRADENITIS EXCISION Left 08/24/2016   Procedure: EXCISION HIDRADENITIS AXILLA;  Surgeon: Robert Bellow, MD;  Location: [facility];  Service: General;  Laterality: Left;  . PERINEAL HIDRADENITIS EXCISION    . WISDOM TOOTH EXTRACTION      Family History  Problem Relation Age of Onset  . Hypertension Father     Social History Social History   Tobacco Use  . Smoking status: Current Some Day Smoker    Packs/day: 0.50    Years: 13.00    Pack years: 6.50    Types: Cigarettes  . Smokeless tobacco: Never Used  Substance Use  Topics  . Alcohol use: Yes    Comment: wine on weekends  . Drug use: No    No Known Allergies  Current Outpatient Medications  Medication Sig Dispense Refill  . acetaminophen (TYLENOL) 500 MG tablet Take 1,000 mg by mouth every 8 (eight) hours as needed (pain).     . Insulin Glargine (TOUJEO MAX SOLOSTAR [place]) Inject 45 Units into the skin at bedtime.     . metFORMIN (GLUCOPHAGE) 500 MG tablet Take 1,000 mg by mouth 2 (two) times daily with a meal.     . metoprolol succinate (TOPROL-XL) 50 MG 24 hr tablet Take 50 mg by mouth daily.     . Norethindrone Acetate-Ethinyl Estrad-FE (JUNEL FE 24) 1-20 MG-MCG(24) tablet Take 1 tablet by mouth daily. 84 tablet 0  . amoxicillin-clavulanate (AUGMENTIN) 875-125 MG tablet Take 1 tablet by mouth 2 (two) times daily for 10 days. 20 tablet 0  . fluconazole (DIFLUCAN) 100 MG tablet Take one tablet at the start of a yeast infection. May repeat if needed in 3-4 days. 1 tablet 2   No current facility-administered medications for this visit.     Review of Systems Review of Systems  All other systems reviewed and are negative.   Blood pressure 127/79, pulse (!) 103, temperature 97.9 F (36.6 C), height 5\' 9"  (1.753 m), weight 208  lb (94.3 kg), last menstrual period 08/06/2018, SpO2 98 %.  Physical Exam Physical Exam Vitals signs reviewed. Exam conducted with a chaperone present.   I performed a focused examination of the area of concern.  In the left inguinal fold, there is erythema and induration.  There is a small skin opening however no pus is draining.  There is no fluctuance.  The area is tender to palpation.  Data Reviewed No relevant data for review  Assessment This is a 33 year old woman with known hidradenitis suprativa.  She has had multiple excisions, incisions and drainages for this entity.  On exam today, there does not appear to be any drainable fluid collection.  Plan We will initiate a 10-day course of Augmentin.  The patient  requested a prescription for fluconazole, as well, as she gets vaginal yeast infections when she takes oral antibiotics.  She should also use moist warm compresses.  I will see her back in 2 weeks time.  Hopefully, things will have resolved; however if they have not, then we will plan for surgical intervention.    Carla Payne 08/13/2018, 5:03 PM

## 2018-08-13 NOTE — Progress Notes (Signed)
Patient ID: Carla Payne, female   DOB: 12/09/1985, 33 y.o.   MRN: [id number]  Chief Complaint  Patient presents with  . Other    infection    HPI Carla Payne is a 33 y.o. female.  She is well-known to our practice, having been seen by multiple providers for various episodes of hidradenitis suprativa.  She is here today with a new site in her inguinal fold.  She states that last Thursday, she noticed some swelling and tenderness in the area.  It subsequently drained some purulent fluid.  The area is still red, tender, and swollen.  She has not taken any antibiotics.  She denies any fevers or chills, no nausea or vomiting.   Past Medical History:  Diagnosis Date  . Anxiety   . 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 Diabetes mellitus without complication ([place]) 1751  . Dysrhythmia    rapid pulse  . Fatty liver   . Hidradenitis 2014  . Hypercholesteremia   . Hyperlipidemia   . Pilonidal cyst 2012    Past Surgical History:  Procedure Laterality Date  . AXILLARY HIDRADENITIS EXCISION  01-05-13  . axillary mass Left 2014 ,may  . BREAST CYST ASPIRATION Left 10/08/2017   2 abcesses removed  . BREAST CYST ASPIRATION Right 10/2017   abcess removed  . COLPOSCOPY  2014  . HYDRADENITIS EXCISION Bilateral 10/12/2014   Procedure: EXCISION HIDRADENITIS GROIN;  Surgeon: Robert Bellow, MD;  Location: [facility];  Service: General;  Laterality: Bilateral;  . HYDRADENITIS EXCISION Left 08/24/2016   Procedure: EXCISION HIDRADENITIS AXILLA;  Surgeon: Robert Bellow, MD;  Location: [facility];  Service: General;  Laterality: Left;  . PERINEAL HIDRADENITIS EXCISION    . WISDOM TOOTH EXTRACTION      Family History  Problem Relation Age of Onset  . Hypertension Father     Social History Social History   Tobacco Use  . Smoking status: Current Some Day Smoker    Packs/day: 0.50    Years: 13.00    Pack years: 6.50    Types: Cigarettes  . Smokeless tobacco: Never Used  Substance Use  Topics  . Alcohol use: Yes    Comment: wine on weekends  . Drug use: No    No Known Allergies  Current Outpatient Medications  Medication Sig Dispense Refill  . acetaminophen (TYLENOL) 500 MG tablet Take 1,000 mg by mouth every 8 (eight) hours as needed (pain).     . Insulin Glargine (TOUJEO MAX SOLOSTAR [place]) Inject 45 Units into the skin at bedtime.     . metFORMIN (GLUCOPHAGE) 500 MG tablet Take 1,000 mg by mouth 2 (two) times daily with a meal.     . metoprolol succinate (TOPROL-XL) 50 MG 24 hr tablet Take 50 mg by mouth daily.     . Norethindrone Acetate-Ethinyl Estrad-FE (JUNEL FE 24) 1-20 MG-MCG(24) tablet Take 1 tablet by mouth daily. 84 tablet 0  . amoxicillin-clavulanate (AUGMENTIN) 875-125 MG tablet Take 1 tablet by mouth 2 (two) times daily for 10 days. 20 tablet 0  . fluconazole (DIFLUCAN) 100 MG tablet Take one tablet at the start of a yeast infection. May repeat if needed in 3-4 days. 1 tablet 2   No current facility-administered medications for this visit.     Review of Systems Review of Systems  All other systems reviewed and are negative.   Blood pressure 127/79, pulse (!) 103, temperature 97.9 F (36.6 C), height 5\' 9"  (1.753 m), weight 208  lb (94.3 kg), last menstrual period 08/06/2018, SpO2 98 %.  Physical Exam Physical Exam Vitals signs reviewed. Exam conducted with a chaperone present.   I performed a focused examination of the area of concern.  In the left inguinal fold, there is erythema and induration.  There is a small skin opening however no pus is draining.  There is no fluctuance.  The area is tender to palpation.  Data Reviewed No relevant data for review  Assessment This is a 33 year old woman with known hidradenitis suprativa.  She has had multiple excisions, incisions and drainages for this entity.  On exam today, there does not appear to be any drainable fluid collection.  Plan We will initiate a 10-day course of Augmentin.  The patient  requested a prescription for fluconazole, as well, as she gets vaginal yeast infections when she takes oral antibiotics.  She should also use moist warm compresses.  I will see her back in 2 weeks time.  Hopefully, things will have resolved; however if they have not, then we will plan for surgical intervention.    Fredirick Maudlin 08/13/2018, 5:03 PM

## 2018-08-13 NOTE — Patient Instructions (Signed)
Finish your antibiotics  Follow up in 2 weeks  Tylenol or Ibuprofen as needed  Use a warm compress to area several times a day

## 2018-08-28 ENCOUNTER — Ambulatory Visit (INDEPENDENT_AMBULATORY_CARE_PROVIDER_SITE_OTHER): Payer: Managed Care, Other (non HMO) | Admitting: General Surgery

## 2018-08-28 ENCOUNTER — Encounter: Payer: Self-pay | Admitting: *Deleted

## 2018-08-28 ENCOUNTER — Encounter: Payer: Self-pay | Admitting: General Surgery

## 2018-08-28 ENCOUNTER — Other Ambulatory Visit: Payer: Self-pay

## 2018-08-28 VITALS — BP 122/80 | HR 79 | Temp 97.9°F | Ht 66.0 in | Wt 204.0 lb

## 2018-08-28 DIAGNOSIS — L732 Hidradenitis suppurativa: Secondary | ICD-10-CM

## 2018-08-28 NOTE — Progress Notes (Signed)
Patient's surgery to be scheduled for 09-03-18 at [facility] with Dr. Celine Ahr.   The patient is aware to have COVID-19 testing done on 08-29-18 at the [facility] drive thru ([address] [place]) between 8:00 am and 10:30 am. She is aware to isolate after, have no visitors, wash hands frequently, and avoid touching face.   The patient is aware she will be contacted by the [facility] to complete a phone interview sometime in the near future.  Patient aware to be NPO after midnight and have a driver.   She is aware to check in at the [facility] where she will be screened for the coronavirus and then sent to [facility].   Patient aware that she may have one visitor due to COVID-19 restrictions.   The patient verbalizes understanding of the above.   The patient is aware to call the office should he/she have further questions.

## 2018-08-28 NOTE — Patient Instructions (Signed)
Hidradenitis Suppurativa Hidradenitis suppurativa is a long-term (chronic) skin disease. It is similar to a severe form of acne, but it affects areas of the body where acne would be unusual, especially areas of the body where skin rubs against skin and becomes moist. These include:  Underarms.  Groin.  Genital area.  Buttocks.  Upper thighs.  Breasts. Hidradenitis suppurativa may start out as small lumps or pimples caused by blocked sweat glands or hair follicles. Pimples may develop into deep sores that break open (rupture) and drain pus. Over time, affected areas of skin may thicken and become scarred. This condition is rare and does not spread from person to person (non-contagious). What are the causes? The exact cause of this condition is not known. It may be related to:  Female and female hormones.  An overactive disease-fighting system (immune system). The immune system may over-react to blocked hair follicles or sweat glands and cause swelling and pus-filled sores. What increases the risk? You are more likely to develop this condition if you:  Are female.  Are 11-55 years old.  Have a family history of hidradenitis suppurativa.  Have a personal history of acne.  Are overweight.  Smoke.  Take the medicine lithium. What are the signs or symptoms? The first symptoms are usually painful bumps in the skin, similar to pimples. The condition may get worse over time (progress), or it may only cause mild symptoms. If the disease progresses, symptoms may include:  Skin bumps getting bigger and growing deeper into the skin.  Bumps rupturing and draining pus.  Itchy, infected skin.  Skin getting thicker and scarred.  Tunnels under the skin (fistulas) where pus drains from a bump.  Pain during daily activities, such as pain during walking if your groin area is affected.  Emotional problems, such as stress or depression. This condition may affect your appearance and your  ability or willingness to wear certain clothes or do certain activities. How is this diagnosed? This condition is diagnosed by a health care provider who specializes in skin diseases (dermatologist). You may be diagnosed based on:  Your symptoms and medical history.  A physical exam.  Testing a pus sample for infection.  Blood tests. How is this treated? Your treatment will depend on how severe your symptoms are. The same treatment will not work for everybody with this condition. You may need to try several treatments to find what works best for you. Treatment may include:  Cleaning and bandaging (dressing) your wounds as needed.  Lifestyle changes, such as new skin care routines.  Taking medicines, such as: ? Antibiotics. ? Acne medicines. ? Medicines to reduce the activity of the immune system. ? A diabetes medicine (metformin). ? Birth control pills, for women. ? Steroids to reduce swelling and pain.  Working with a mental health care provider, if you experience emotional distress due to this condition. If you have severe symptoms that do not get better with medicine, you may need surgery. Surgery may involve:  Using a laser to clear the skin and remove hair follicles.  Opening and draining deep sores.  Removing the areas of skin that are diseased and scarred. Follow these instructions at home: Medicines   Take over-the-counter and prescription medicines only as told by your health care provider.  If you were prescribed an antibiotic medicine, take it as told by your health care provider. Do not stop taking the antibiotic even if your condition improves. Skin care  If you have open wounds, cover   them with a clean dressing as told by your health care provider. Keep wounds clean by washing them gently with soap and water when you bathe.  Do not shave the areas where you get hidradenitis suppurativa.  Do not wear deodorant.  Wear loose-fitting clothes.  Try to avoid  getting overheated or sweaty. If you get sweaty or wet, change into clean, dry clothes as soon as you can.  To help relieve pain and itchiness, cover sore areas with a warm, clean washcloth (warm compress) for 5-10 minutes as often as needed.  If told by your health care provider, take a bleach bath twice a week: ? Fill your bathtub halfway with water. ? Pour in  cup of unscented household bleach. ? Soak in the tub for 5-10 minutes. ? Only soak from the neck down. Avoid water on your face and hair. ? Shower to rinse off the bleach from your skin. General instructions  Learn as much as you can about your disease so that you have an active role in your treatment. Work closely with your health care provider to find treatments that work for you.  If you are overweight, work with your health care provider to lose weight as recommended.  Do not use any products that contain nicotine or tobacco, such as cigarettes and e-cigarettes. If you need help quitting, ask your health care provider.  If you struggle with living with this condition, talk with your health care provider or work with a mental health care provider as recommended.  Keep all follow-up visits as told by your health care provider. This is important. Where to find more information  Hidradenitis [place].: https://www.hs-foundation.org/ Contact a health care provider if you have:  A flare-up of hidradenitis suppurativa.  A fever or chills.  Trouble controlling your symptoms at home.  Trouble doing your daily activities because of your symptoms.  Trouble dealing with emotional problems related to your condition. Summary  Hidradenitis suppurativa is a long-term (chronic) skin disease. It is similar to a severe form of acne, but it affects areas of the body where acne would be unusual.  The first symptoms are usually painful bumps in the skin, similar to pimples. The condition may get worse over time  (progress), or it may only cause mild symptoms.  If you have open wounds, cover them with a clean dressing as told by your health care provider. Keep wounds clean by washing them gently with soap and water when you bathe.  Besides skin care, treatment may include medicines, laser treatment, and surgery. This information is not intended to replace advice given to you by your health care provider. Make sure you discuss any questions you have with your health care provider. Document Released: 08/16/2003 Document Revised: 01/09/2017 Document Reviewed: 01/09/2017 Elsevier Patient Education  2020 [employer].  Patient to have surgery

## 2018-08-28 NOTE — Progress Notes (Signed)
Patient ID: Carla Payne, female   DOB: 31-Jan-1985, 33 y.o.   MRN: [id number]  Chief Complaint  Patient presents with  . Follow-up    HPI Carla Payne is a 33 y.o. female.   I first saw her 2 weeks ago.  My initial HPI is copied here:  "She is well-known to our practice, having been seen by multiple providers for various episodes of hidradenitis suprativa.  She is here today with a new site in her inguinal fold.  She states that last Thursday, she noticed some swelling and tenderness in the area.  It subsequently drained some purulent fluid.  The area is still red, tender, and swollen.  She has not taken any antibiotics.  She denies any fevers or chills, no nausea or vomiting."  At that visit, I placed her on antibiotics and asked her to come see me again for follow-up.  She states that while she was on the antibiotics, the area got better.  Once she stopped them, the area became indurated and tender again.  She feels like perhaps something drained or burst open last night.  She has not had any fevers or chills.  The area remains tender today.    Past Medical History:  Diagnosis Date  . Anxiety   . 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 Diabetes mellitus without complication ([place]) 4431  . Dysrhythmia    rapid pulse  . Fatty liver   . Hidradenitis 2014  . Hypercholesteremia   . Hyperlipidemia   . Pilonidal cyst 2012    Past Surgical History:  Procedure Laterality Date  . AXILLARY HIDRADENITIS EXCISION  01-05-13  . axillary mass Left 2014 ,may  . BREAST CYST ASPIRATION Left 10/08/2017   2 abcesses removed  . BREAST CYST ASPIRATION Right 10/2017   abcess removed  . COLPOSCOPY  2014  . HYDRADENITIS EXCISION Bilateral 10/12/2014   Procedure: EXCISION HIDRADENITIS GROIN;  Surgeon: Robert Bellow, MD;  Location: [facility] ORS;  Service: General;  Laterality: Bilateral;  . HYDRADENITIS EXCISION Left 08/24/2016   Procedure: EXCISION HIDRADENITIS AXILLA;  Surgeon: Robert Bellow,  MD;  Location: [facility] ORS;  Service: General;  Laterality: Left;  . PERINEAL HIDRADENITIS EXCISION    . WISDOM TOOTH EXTRACTION      Family History  Problem Relation Age of Onset  . Hypertension Father     Social History Social History   Tobacco Use  . Smoking status: Current Some Day Smoker    Packs/day: 0.50    Years: 13.00    Pack years: 6.50    Types: Cigarettes  . Smokeless tobacco: Never Used  Substance Use Topics  . Alcohol use: Yes    Comment: wine on weekends  . Drug use: No    No Known Allergies  Current Outpatient Medications  Medication Sig Dispense Refill  . acetaminophen (TYLENOL) 500 MG tablet Take 1,000 mg by mouth every 8 (eight) hours as needed (pain).     . Insulin Glargine (TOUJEO MAX SOLOSTAR [place]) Inject 45 Units into the skin at bedtime.     . metFORMIN (GLUCOPHAGE) 500 MG tablet Take 1,000 mg by mouth 2 (two) times daily with a meal.     . metoprolol succinate (TOPROL-XL) 50 MG 24 hr tablet Take 50 mg by mouth daily.     . Norethindrone Acetate-Ethinyl Estrad-FE (JUNEL FE 24) 1-20 MG-MCG(24) tablet Take 1 tablet by mouth daily. 84 tablet 0   No current facility-administered medications for this visit.  Review of Systems Review of Systems  All other systems reviewed and are negative.   Blood pressure 122/80, pulse 79, temperature 97.9 F (36.6 C), temperature source Skin, height 5\' 6"  (1.676 m), weight 204 lb (92.5 kg), last menstrual period 08/06/2018, SpO2 98 %.  Physical Exam Physical Exam Vitals signs reviewed. Exam conducted with a chaperone present.  Constitutional:      General: She is not in acute distress.    Appearance: Normal appearance. She is obese.  HENT:     Head: Normocephalic and atraumatic.     Nose:     Comments: Covered with a mask secondary to COVID-19 precautions    Mouth/Throat:     Comments: Covered with a mask secondary to COVID-19 precautions Eyes:     General:        Right eye: No discharge.        Left  eye: No discharge.     Conjunctiva/sclera: Conjunctivae normal.  Neck:     Musculoskeletal: Normal range of motion.  Cardiovascular:     Rate and Rhythm: Normal rate and regular rhythm.     Pulses: Normal pulses.  Pulmonary:     Effort: Pulmonary effort is normal.     Breath sounds: Normal breath sounds.  Abdominal:     General: Bowel sounds are normal.     Palpations: Abdomen is soft.    Genitourinary:      Comments: In the left inguinal fold, there is indurated tissue and pitting consistent with hidradenitis suprativa.  No gross fluctuance or purulent drainage is identified. Musculoskeletal:        General: No swelling.  Lymphadenopathy:     Cervical: No cervical adenopathy.  Skin:    General: Skin is warm and dry.  Neurological:     General: No focal deficit present.     Mental Status: She is alert and oriented to person, place, and time.  Psychiatric:        Mood and Affect: Mood normal.        Behavior: Behavior normal.     Data Reviewed No relevant data to review  Assessment This is a 33 year old woman who has had multiple episodes of recurrent hidradenitis.  The current episode involves her left inguinal fold.  It did improve with antibiotics, but once she stopped taking them, the area regressed.  Plan I have offered her a surgical excision of the area.  This will be done on an outpatient basis.  She understands that she will need to perform dressing changes after the operation.  We will get this scheduled at the soonest possible date.    Fredirick Maudlin 08/28/2018, 4:11 PM

## 2018-08-29 ENCOUNTER — Other Ambulatory Visit
Admission: RE | Admit: 2018-08-29 | Discharge: 2018-08-29 | Disposition: A | Discharge status: Home / Self Care | Payer: Managed Care, Other (non HMO) | Source: Ambulatory Visit | Attending: General Surgery | Admitting: General Surgery

## 2018-08-29 DIAGNOSIS — Z20828 Contact with and (suspected) exposure to other viral communicable diseases: Secondary | ICD-10-CM | POA: Diagnosis not present

## 2018-08-29 DIAGNOSIS — Z01812 Encounter for preprocedural laboratory examination: Secondary | ICD-10-CM | POA: Insufficient documentation

## 2018-08-29 LAB — SARS CORONAVIRUS 2 (TAT 6-24 HRS): SARS Coronavirus 2: NEGATIVE

## 2018-09-01 ENCOUNTER — Encounter
Admission: RE | Admit: 2018-09-01 | Discharge: 2018-09-01 | Disposition: A | Discharge status: Home / Self Care | Payer: Managed Care, Other (non HMO) | Source: Ambulatory Visit | Attending: General Surgery | Admitting: General Surgery

## 2018-09-01 ENCOUNTER — Other Ambulatory Visit: Payer: Self-pay

## 2018-09-01 NOTE — Patient Instructions (Signed)
Your procedure is scheduled on: 09/03/18 Report to Day Surgery.[facility]OND FLOOR To find out your arrival time please call [phone] between 1PM - 3PM on 09/02/18.  Remember: Instructions that are not followed completely may result in serious medical risk,  up to and including death, or upon the discretion of your surgeon and anesthesiologist your  surgery may need to be rescheduled.     _X__ 1. Do not eat food after midnight the night before your procedure.                 No gum chewing or hard candies. You may drink clear liquids up to 2 hours                 before you are scheduled to arrive for your surgery- DO not drink clear                 liquids within 2 hours of the start of your surgery.                 Clear Liquids include:  water, apple juice without pulp, clear carbohydrate                 drink such as Clearfast of Gatorade, Black Coffee or Tea (Do not add                 anything to coffee or tea).  __X__2.  On the morning of surgery brush your teeth with toothpaste and water, you                may rinse your mouth with mouthwash if you wish.  Do not swallow any toothpaste of mouthwash.     _X__ 3.  No Alcohol for 24 hours before or after surgery.   _X__ 4.  Do Not Smoke or use e-cigarettes For 24 Hours Prior to Your Surgery.                 Do not use any chewable tobacco products for at least 6 hours prior to                 surgery.  ____  5.  Bring all medications with you on the day of surgery if instructed.   _X___  6.  Notify your doctor if there is any change in your medical condition      (cold, fever, infections).     Do not wear jewelry, make-up, hairpins, clips or nail polish. Do not wear lotions, powders, or perfumes. You may wear deodorant. Do not shave 48 hours prior to surgery. Men may shave face and neck. Do not bring valuables to the hospital.    [facility] is not responsible for any belongings or  valuables.  Contacts, dentures or bridgework may not be worn into surgery. Leave your suitcase in the car. After surgery it may be brought to your room. For patients admitted to the hospital, discharge time is determined by your treatment team.   Patients discharged the day of surgery will not be allowed to drive home.            ____ Take these medicines the morning of surgery with A SIP OF WATER:    1. NONE  2.   3.   4.  5.  6.  ____ Fleet Enema (as directed)   ____ Use CHG Soap as directed  ____ Use inhalers on the day of surgery  ___X_ Stop  metformin 2 days prior to surgery    _X___ Take 1/2 of usual insulin dose the night before surgery. No insulin the morning          of surgery.   ____ Stop Coumadin/Plavix/aspirin on   ____ Stop Anti-inflammatories on    ____ Stop supplements until after surgery.    ____ Bring C-Pap to the hospital.

## 2018-09-03 ENCOUNTER — Other Ambulatory Visit: Payer: Self-pay

## 2018-09-03 ENCOUNTER — Ambulatory Visit: Payer: Managed Care, Other (non HMO) | Admitting: Anesthesiology

## 2018-09-03 ENCOUNTER — Ambulatory Visit
Admission: RE | Admit: 2018-09-03 | Discharge: 2018-09-03 | Disposition: A | Discharge status: Home / Self Care | Payer: Managed Care, Other (non HMO) | Attending: General Surgery | Admitting: General Surgery

## 2018-09-03 ENCOUNTER — Encounter
Admission: RE | Disposition: A | Discharge status: Home / Self Care | Payer: Self-pay | Source: Home / Self Care | Attending: General Surgery

## 2018-09-03 DIAGNOSIS — F17210 Nicotine dependence, cigarettes, uncomplicated: Secondary | ICD-10-CM | POA: Insufficient documentation

## 2018-09-03 DIAGNOSIS — Z793 Long term (current) use of hormonal contraceptives: Secondary | ICD-10-CM | POA: Insufficient documentation

## 2018-09-03 DIAGNOSIS — E119 Type 2 diabetes mellitus without complications: Secondary | ICD-10-CM | POA: Insufficient documentation

## 2018-09-03 DIAGNOSIS — Z79899 Other long term (current) drug therapy: Secondary | ICD-10-CM | POA: Insufficient documentation

## 2018-09-03 DIAGNOSIS — L732 Hidradenitis suppurativa: Secondary | ICD-10-CM | POA: Insufficient documentation

## 2018-09-03 DIAGNOSIS — Z794 Long term (current) use of insulin: Secondary | ICD-10-CM | POA: Diagnosis not present

## 2018-09-03 HISTORY — PX: HYDRADENITIS EXCISION: SHX5243

## 2018-09-03 LAB — BASIC METABOLIC PANEL
Anion gap: 12 (ref 5–15)
BUN: 15 mg/dL (ref 6–20)
CO2: 20 mmol/L — ABNORMAL LOW (ref 22–32)
Calcium: 8.9 mg/dL (ref 8.9–10.3)
Chloride: 100 mmol/L (ref 98–111)
Creatinine, Ser: 0.57 mg/dL (ref 0.44–1.00)
GFR calc Af Amer: >60 mL/min (ref >60)
GFR calc non Af Amer: >60 mL/min (ref >60)
Glucose, Bld: 343 mg/dL — ABNORMAL HIGH (ref 70–99)
Potassium: 3.8 mmol/L (ref 3.5–5.1)
Sodium: 132 mmol/L — ABNORMAL LOW (ref 135–145)

## 2018-09-03 LAB — GLUCOSE, CAPILLARY
Glucose-Capillary: 357 mg/dL — ABNORMAL HIGH (ref 70–99)
Glucose-Capillary: 358 mg/dL — ABNORMAL HIGH (ref 70–99)
Glucose-Capillary: 289 mg/dL — ABNORMAL HIGH (ref 70–99)

## 2018-09-03 LAB — POCT PREGNANCY, URINE: Preg Test, Ur: NEGATIVE

## 2018-09-03 SURGERY — EXCISION, HIDRADENITIS, INGUINAL REGION
Anesthesia: General | Laterality: Left

## 2018-09-03 MED ORDER — PROPOFOL 10 MG/ML IV BOLUS
INTRAVENOUS | Status: DC | PRN
  Administered 2018-09-03: 180 mg via INTRAVENOUS

## 2018-09-03 MED ORDER — ACETAMINOPHEN 500 MG PO TABS
ORAL_TABLET | ORAL | Status: AC
  Administered 2018-09-03: 08:00:00 1000 mg via ORAL
  Filled 2018-09-03: qty 2

## 2018-09-03 MED ORDER — BUPIVACAINE LIPOSOME 1.3 % IJ SUSP: 20.0000 mL | Freq: Once | INTRAMUSCULAR | Status: DC

## 2018-09-03 MED ORDER — "ABDOMINAL PAD 8""X10"" PADS": 1.0000 [IU] | MEDICATED_PAD | Freq: Two times a day (BID) | 2 refills | Status: DC

## 2018-09-03 MED ORDER — SUCCINYLCHOLINE CHLORIDE 20 MG/ML IJ SOLN
INTRAMUSCULAR | Status: AC
  Filled 2018-09-03: qty 1

## 2018-09-03 MED ORDER — CEFAZOLIN SODIUM-DEXTROSE 2-4 GM/100ML-% IV SOLN
INTRAVENOUS | Status: AC
  Filled 2018-09-03: qty 100

## 2018-09-03 MED ORDER — LACTATED RINGERS IV SOLN
INTRAVENOUS | Status: DC | PRN
  Administered 2018-09-03: 09:00:00 via INTRAVENOUS

## 2018-09-03 MED ORDER — SODIUM CHLORIDE 0.9 % IV SOLN
INTRAVENOUS | Status: DC
  Administered 2018-09-03: 08:00:00 via INTRAVENOUS

## 2018-09-03 MED ORDER — FENTANYL CITRATE (PF) 100 MCG/2ML IJ SOLN
INTRAMUSCULAR | Status: AC
  Administered 2018-09-03: 09:00:00 25 ug via INTRAVENOUS
  Filled 2018-09-03: qty 2

## 2018-09-03 MED ORDER — OXYCODONE HCL 5 MG PO TABS
ORAL_TABLET | ORAL | Status: AC
  Filled 2018-09-03: qty 1

## 2018-09-03 MED ORDER — BUPIVACAINE HCL (PF) 0.25 % IJ SOLN
INTRAMUSCULAR | Status: AC
  Filled 2018-09-03: qty 30

## 2018-09-03 MED ORDER — CHLORHEXIDINE GLUCONATE CLOTH 2 % EX PADS: 6.0000 | MEDICATED_PAD | Freq: Once | CUTANEOUS | Status: DC

## 2018-09-03 MED ORDER — LIDOCAINE HCL (CARDIAC) PF 100 MG/5ML IV SOSY
PREFILLED_SYRINGE | INTRAVENOUS | Status: DC | PRN
  Administered 2018-09-03: 80 mg via INTRAVENOUS

## 2018-09-03 MED ORDER — PROPOFOL 10 MG/ML IV BOLUS
INTRAVENOUS | Status: AC
  Filled 2018-09-03: qty 20

## 2018-09-03 MED ORDER — OXYCODONE HCL 5 MG/5ML PO SOLN: 5.0000 mg | Freq: Once | ORAL | Status: AC | PRN

## 2018-09-03 MED ORDER — PROMETHAZINE HCL 25 MG/ML IJ SOLN: 6.2500 mg | INTRAMUSCULAR | Status: DC | PRN

## 2018-09-03 MED ORDER — ACETAMINOPHEN 500 MG PO TABS
1000.0000 mg | ORAL_TABLET | ORAL | Status: AC
  Administered 2018-09-03: 08:00:00 1000 mg via ORAL

## 2018-09-03 MED ORDER — FENTANYL CITRATE (PF) 100 MCG/2ML IJ SOLN
25.0000 ug | INTRAMUSCULAR | Status: DC | PRN
  Administered 2018-09-03 (×3): 25 ug via INTRAVENOUS

## 2018-09-03 MED ORDER — MEPERIDINE HCL 50 MG/ML IJ SOLN: 6.2500 mg | INTRAMUSCULAR | Status: DC | PRN

## 2018-09-03 MED ORDER — ONDANSETRON HCL 4 MG/2ML IJ SOLN
INTRAMUSCULAR | Status: AC
  Filled 2018-09-03: qty 2

## 2018-09-03 MED ORDER — FENTANYL CITRATE (PF) 100 MCG/2ML IJ SOLN
INTRAMUSCULAR | Status: AC
  Filled 2018-09-03: qty 2

## 2018-09-03 MED ORDER — OXYCODONE HCL 5 MG PO TABS
5.0000 mg | ORAL_TABLET | Freq: Once | ORAL | Status: AC | PRN
  Administered 2018-09-03: 10:00:00 5 mg via ORAL

## 2018-09-03 MED ORDER — DEXAMETHASONE SODIUM PHOSPHATE 10 MG/ML IJ SOLN
INTRAMUSCULAR | Status: DC | PRN
  Administered 2018-09-03: 10 mg via INTRAVENOUS

## 2018-09-03 MED ORDER — SODIUM CHLORIDE 0.9 % IR SOLN: 10.0000 mL | Freq: Two times a day (BID) | 1 refills | Status: DC

## 2018-09-03 MED ORDER — INSULIN ASPART 100 UNIT/ML ~~LOC~~ SOLN
10.0000 [IU] | Freq: Once | SUBCUTANEOUS | Status: AC
  Administered 2018-09-03: 08:00:00 10 [IU] via SUBCUTANEOUS

## 2018-09-03 MED ORDER — ROCURONIUM BROMIDE 50 MG/5ML IV SOLN
INTRAVENOUS | Status: AC
  Filled 2018-09-03: qty 1

## 2018-09-03 MED ORDER — FAMOTIDINE 20 MG PO TABS
ORAL_TABLET | ORAL | Status: AC
  Administered 2018-09-03: 08:00:00 20 mg via ORAL
  Filled 2018-09-03: qty 1

## 2018-09-03 MED ORDER — HYDROCODONE-ACETAMINOPHEN 5-325 MG PO TABS: 1.0000 | ORAL_TABLET | Freq: Four times a day (QID) | ORAL | 0 refills | Status: DC | PRN

## 2018-09-03 MED ORDER — CELECOXIB 200 MG PO CAPS
200.0000 mg | ORAL_CAPSULE | ORAL | Status: AC
  Administered 2018-09-03: 08:00:00 200 mg via ORAL

## 2018-09-03 MED ORDER — FAMOTIDINE 20 MG PO TABS
20.0000 mg | ORAL_TABLET | Freq: Once | ORAL | Status: AC
  Administered 2018-09-03: 08:00:00 20 mg via ORAL

## 2018-09-03 MED ORDER — ONDANSETRON HCL 4 MG/2ML IJ SOLN
INTRAMUSCULAR | Status: DC | PRN
  Administered 2018-09-03: 4 mg via INTRAVENOUS

## 2018-09-03 MED ORDER — MIDAZOLAM HCL 2 MG/2ML IJ SOLN
INTRAMUSCULAR | Status: AC
  Filled 2018-09-03: qty 2

## 2018-09-03 MED ORDER — DEXAMETHASONE SODIUM PHOSPHATE 10 MG/ML IJ SOLN
INTRAMUSCULAR | Status: AC
  Filled 2018-09-03: qty 1

## 2018-09-03 MED ORDER — FENTANYL CITRATE (PF) 100 MCG/2ML IJ SOLN
INTRAMUSCULAR | Status: DC | PRN
  Administered 2018-09-03 (×2): 50 ug via INTRAVENOUS

## 2018-09-03 MED ORDER — MIDAZOLAM HCL 2 MG/2ML IJ SOLN
INTRAMUSCULAR | Status: DC | PRN
  Administered 2018-09-03: 2 mg via INTRAVENOUS

## 2018-09-03 MED ORDER — LIDOCAINE-EPINEPHRINE 1 %-1:100000 IJ SOLN
INTRAMUSCULAR | Status: DC | PRN
  Administered 2018-09-03: 10 mL via SUBCUTANEOUS

## 2018-09-03 MED ORDER — LIDOCAINE-EPINEPHRINE 1 %-1:100000 IJ SOLN
INTRAMUSCULAR | Status: AC
  Filled 2018-09-03: qty 1

## 2018-09-03 MED ORDER — LIDOCAINE HCL (PF) 2 % IJ SOLN
INTRAMUSCULAR | Status: AC
  Filled 2018-09-03: qty 10

## 2018-09-03 MED ORDER — CEFAZOLIN SODIUM-DEXTROSE 2-4 GM/100ML-% IV SOLN
2.0000 g | INTRAVENOUS | Status: AC
  Administered 2018-09-03: 2 g via INTRAVENOUS

## 2018-09-03 MED ORDER — CELECOXIB 200 MG PO CAPS
ORAL_CAPSULE | ORAL | Status: AC
  Administered 2018-09-03: 08:00:00 200 mg via ORAL
  Filled 2018-09-03: qty 1

## 2018-09-03 MED ORDER — INSULIN ASPART 100 UNIT/ML ~~LOC~~ SOLN
SUBCUTANEOUS | Status: AC
  Filled 2018-09-03: qty 1

## 2018-09-03 MED ORDER — "GAUZE PADS & DRESSINGS 4-1/4""X4-1/4"" PADS": 1.0000 [IU] | MEDICATED_PAD | Freq: Two times a day (BID) | 2 refills | Status: DC

## 2018-09-03 SURGICAL SUPPLY — 27 items
ADH SKN CLS APL DERMABOND .7 (GAUZE/BANDAGES/DRESSINGS) ×1
APL PRP STRL LF DISP 70% ISPRP (MISCELLANEOUS) ×1
BLADE SURG 15 STRL LF DISP TIS (BLADE) ×1 IMPLANT
BLADE SURG 15 STRL SS (BLADE) ×2
CANISTER SUCT 1200ML W/VALVE (MISCELLANEOUS) ×2 IMPLANT
CHLORAPREP W/TINT 26 (MISCELLANEOUS) ×2 IMPLANT
COVER WAND RF STERILE (DRAPES) ×2 IMPLANT
DERMABOND ADVANCED (GAUZE/BANDAGES/DRESSINGS) ×1
DERMABOND ADVANCED .7 DNX12 (GAUZE/BANDAGES/DRESSINGS) ×1 IMPLANT
DRAPE LAPAROTOMY TRNSV 106X77 (MISCELLANEOUS) ×2 IMPLANT
GAUZE PACKING IODOFORM 1/2 (PACKING) ×2 IMPLANT
GAUZE SPONGE 4X4 12PLY STRL (GAUZE/BANDAGES/DRESSINGS) ×2 IMPLANT
GLOVE BIO SURGEON STRL SZ 6.5 (GLOVE) ×2 IMPLANT
GLOVE INDICATOR 7.0 STRL GRN (GLOVE) ×8 IMPLANT
GOWN STRL REUS W/ TWL LRG LVL3 (GOWN DISPOSABLE) ×2 IMPLANT
GOWN STRL REUS W/TWL LRG LVL3 (GOWN DISPOSABLE) ×6
LABEL OR SOLS (LABEL) ×2 IMPLANT
NEEDLE HYPO 22GX1.5 SAFETY (NEEDLE) ×2 IMPLANT
NS IRRIG 500ML POUR BTL (IV SOLUTION) ×2 IMPLANT
PACK BASIN MINOR ARMC (MISCELLANEOUS) ×2 IMPLANT
PAD ABD DERMACEA PRESS 5X9 (GAUZE/BANDAGES/DRESSINGS) ×1 IMPLANT
SPONGE LAP 18X18 RF (DISPOSABLE) ×2 IMPLANT
SUT MNCRL 4-0 (SUTURE) ×2
SUT MNCRL 4-0 27XMFL (SUTURE) ×1
SUT VIC AB 2-0 CT2 27 (SUTURE) ×4 IMPLANT
SUTURE MNCRL 4-0 27XMF (SUTURE) ×1 IMPLANT
SYR 10ML LL (SYRINGE) ×4 IMPLANT

## 2018-09-03 NOTE — Anesthesia Postprocedure Evaluation (Signed)
Anesthesia Post Note  Patient: Carla Payne  Procedure(s) Performed: EXCISION HIDRADENITIS GROIN, LEFT, DIABETIC (Left )  Patient location during evaluation: PACU Anesthesia Type: General Level of consciousness: awake and alert and oriented Pain management: pain level controlled Vital Signs Assessment: post-procedure vital signs reviewed and stable Respiratory status: spontaneous breathing, nonlabored ventilation and respiratory function stable Cardiovascular status: blood pressure returned to baseline and stable Postop Assessment: no signs of nausea or vomiting Anesthetic complications: no     Last Vitals:  Vitals:   09/03/18 0956 09/03/18 1024  BP: (!) 120/93 123/84  Pulse: 80 77  Resp: 18 18  Temp: (!) 36.1 C   SpO2:  100%    Last Pain:  Vitals:   09/03/18 1024  TempSrc:   PainSc: 0-No pain                 Jahmiya Guidotti

## 2018-09-03 NOTE — Transfer of Care (Signed)
Immediate Anesthesia Transfer of Care Note  Patient: Carla Payne  Procedure(s) Performed: EXCISION HIDRADENITIS GROIN, LEFT, DIABETIC (Left )  Patient Location: PACU  Anesthesia Type:General  Level of Consciousness: awake and sedated  Airway & Oxygen Therapy: Patient Spontanous Breathing and Patient connected to face mask oxygen  Post-op Assessment: Report given to RN and Post -op Vital signs reviewed and stable  Post vital signs: Reviewed and stable  Last Vitals:  Vitals Value Taken Time  BP    Temp    Pulse 79 09/03/18 0904  Resp    SpO2 100 % 09/03/18 0904  Vitals shown include unvalidated device data.  Last Pain:  Vitals:   09/03/18 0745  TempSrc: Temporal  PainSc: 0-No pain         Complications: No apparent anesthesia complications

## 2018-09-03 NOTE — Anesthesia Procedure Notes (Signed)
Procedure Name: LMA Insertion Date/Time: 09/03/2018 8:36 AM Performed by: Philbert Riser, CRNA Pre-anesthesia Checklist: Patient identified, Emergency Drugs available, Suction available, Patient being monitored and Timeout performed Patient Re-evaluated:Patient Re-evaluated prior to induction Oxygen Delivery Method: Circle system utilized and Simple face mask Preoxygenation: Pre-oxygenation with 100% oxygen Induction Type: IV induction Ventilation: Mask ventilation without difficulty LMA: LMA inserted LMA Size: 3.5 Placement Confirmation: positive ETCO2 and breath sounds checked- equal and bilateral Dental Injury: Teeth and Oropharynx as per pre-operative assessment

## 2018-09-03 NOTE — OR Nursing (Signed)
10 units of novolog insulin given for BS of 357 per Dr. Randa Lynn. Repeat BS after 35 minutes is 358. Dr. Randa Lynn and Dr. Celine Ahr notified. Dr. Randa Lynn reports she will monitor.

## 2018-09-03 NOTE — Anesthesia Preprocedure Evaluation (Signed)
Anesthesia Evaluation  Patient identified by MRN, date of birth, ID band Patient awake    Reviewed: Allergy & Precautions, NPO status , Patient's Chart, lab work & pertinent test results  History of Anesthesia Complications Negative for: history of anesthetic complications  Airway Mallampati: II  TM Distance: >3 FB Neck ROM: Full    Dental no notable dental hx.    Pulmonary neg sleep apnea, neg COPD, Current Smoker and Patient abstained from smoking.,    breath sounds clear to auscultation- rhonchi (-) wheezing      Cardiovascular (-) hypertension(-) CAD, (-) Past MI, (-) Cardiac Stents and (-) CABG  Rhythm:Regular Rate:Normal - Systolic murmurs and - Diastolic murmurs    Neuro/Psych neg Seizures Anxiety negative neurological ROS     GI/Hepatic negative GI ROS, Neg liver ROS,   Endo/Other  diabetes, Insulin Dependent  Renal/GU negative Renal ROS     Musculoskeletal negative musculoskeletal ROS (+)   Abdominal (+) + obese,   Peds  Hematology negative hematology ROS (+)   Anesthesia Other Findings Past Medical History: No date: Anxiety 2014: Cervical dysplasia     Comment:  CINI 2014: Diabetes mellitus without complication ([place]) No date: Dysrhythmia     Comment:  rapid pulse No date: Fatty liver 2014: Hidradenitis No date: Hypercholesteremia No date: Hyperlipidemia 2012: Pilonidal cyst   Reproductive/Obstetrics                             Anesthesia Physical Anesthesia Plan  ASA: II  Anesthesia Plan: General   Post-op Pain Management:    Induction: Intravenous  PONV Risk Score and Plan: 1 and Ondansetron, Dexamethasone and Midazolam  Airway Management Planned: LMA  Additional Equipment:   Intra-op Plan:   Post-operative Plan:   Informed Consent: I have reviewed the patients History and Physical, chart, labs and discussed the procedure including the risks, benefits and  alternatives for the proposed anesthesia with the patient or authorized representative who has indicated his/her understanding and acceptance.     Dental advisory given  Plan Discussed with: CRNA and Anesthesiologist  Anesthesia Plan Comments:         Anesthesia Quick Evaluation

## 2018-09-03 NOTE — Op Note (Signed)
Operative Note  Preoperative Diagnosis: Hidradenitis suprativa  Postoperative Diagnosis: Same  Operation: Excision of left inguinal fold hidradenitis  Surgeon: Fredirick Maudlin, MD  Assistant: None  Anesthesia: General via LMA  Findings: There were no tunnels or tracts found.  The disease was localized to a small area, roughly 6 x 2 cm.  It was all quite superficial and completely excised.  Indications: Carla Payne is a 33 year old woman who has a longstanding history of hidradenitis.  She presented to our clinic with active disease in her left inguinal fold.  We attempted to treat this with antibiotics, however we were unsuccessful, therefore the decision was made to proceed with surgical excision of the area of active disease.  Procedure In Detail: The patient was identified in the preoperative holding area and brought to the operating room where she was placed supine on the OR table.  Bony prominences were padded and bilateral sequential compression devices were placed on the lower extremities.  Anesthesia was induced via laryngeal mask airway.  The patient was positioned appropriately for the operation.  The surgical area was clipped and then sterilely prepped and draped in standard fashion.  A timeout was performed confirming the patient's identity, the procedure being performed, her allergies, all necessary equipment was available, and that maintenance anesthesia was adequate.  A perioperative dose of antibiotics was administered.  Local anesthesia was injected surrounding the inflamed tissue in the left inguinal fold.  Elliptical incision was then made to encompass the active disease.  This was carried down through the subcutaneum to the fat.  The area was probed and no additional sinus tracts were appreciated.  The involved skin was excised and handed off as a specimen.  Hemostasis was achieved with electrocautery.  A moist dressing was placed and covered with a dry Army battle dressing.   The patient was then awakened, the LMA removed, and taken to the postanesthesia care unit in good condition  EBL: Less than 2 cc  IVF: See anesthesia record  Specimen(s): Left inguinal hidradenitis  Complications: none immediately apparent.   Counts: all needles, instruments, and sponges were counted and reported to be correct in number at the end of the case.   I was present for and participated in the entire operation.  Fredirick Maudlin  9:18 AM

## 2018-09-03 NOTE — Discharge Instructions (Signed)

## 2018-09-03 NOTE — Interval H&P Note (Signed)
History and Physical Interval Note:  09/03/2018 8:24 AM  Carla Payne  has presented today for surgery, with the diagnosis of L73.2 HIDRADENITIS SUPPURATIVA.  The various methods of treatment have been discussed with the patient and family. After consideration of risks, benefits and other options for treatment, the patient has consented to  Procedure(s): EXCISION HIDRADENITIS GROIN, LEFT, DIABETIC (Left) as a surgical intervention.  The patient's history has been reviewed, patient examined, no change in status, stable for surgery.  I have reviewed the patient's chart and labs.  Questions were answered to the patient's satisfaction.     Fredirick Maudlin

## 2018-09-03 NOTE — Anesthesia Post-op Follow-up Note (Signed)
Anesthesia QCDR form completed.        

## 2018-09-04 LAB — SURGICAL PATHOLOGY

## 2018-09-08 ENCOUNTER — Telehealth: Payer: Self-pay | Admitting: *Deleted

## 2018-09-08 ENCOUNTER — Ambulatory Visit: Payer: Managed Care, Other (non HMO) | Admitting: Obstetrics and Gynecology

## 2018-09-08 NOTE — Telephone Encounter (Signed)
Notified patient as instructed, patient pleased. patient agrees  

## 2018-09-08 NOTE — Telephone Encounter (Signed)
Telephone Triage Questions    Type of surgery: Excision Hidradenitis left Groin                         Date: 09/03/18                 Physician: Everardo Beals  Patient wants another refill on Hydrocodone

## 2018-09-08 NOTE — Telephone Encounter (Signed)
She should just use ibuprofen and tylenol. It wasn't a very big incision.

## 2018-09-09 ENCOUNTER — Telehealth: Payer: Self-pay

## 2018-09-09 NOTE — Telephone Encounter (Signed)
Disability paperwork completed and faxed to [employer] at this time. No return to work date confirmed until her appointment 09/18/2018.

## 2018-09-18 ENCOUNTER — Encounter: Payer: Self-pay | Admitting: General Surgery

## 2018-09-18 ENCOUNTER — Ambulatory Visit (INDEPENDENT_AMBULATORY_CARE_PROVIDER_SITE_OTHER): Payer: Managed Care, Other (non HMO) | Admitting: General Surgery

## 2018-09-18 ENCOUNTER — Other Ambulatory Visit: Payer: Self-pay

## 2018-09-18 VITALS — BP 120/80 | HR 111 | Temp 97.5°F | Resp 16 | Ht 69.0 in | Wt 202.0 lb

## 2018-09-18 DIAGNOSIS — L732 Hidradenitis suppurativa: Secondary | ICD-10-CM

## 2018-09-18 NOTE — Patient Instructions (Addendum)
Please apply Gold bond powder to the discolored skin below the wound.  Please continue to wash the area with soap and water and apply a dry dressing. Please see your follow up appointment listed below.        Hidradenitis Suppurativa Hidradenitis suppurativa is a long-term (chronic) skin disease. It is similar to a severe form of acne, but it affects areas of the body where acne would be unusual, especially areas of the body where skin rubs against skin and becomes moist. These include:  Underarms.  Groin.  Genital area.  Buttocks.  Upper thighs.  Breasts. Hidradenitis suppurativa may start out as small lumps or pimples caused by blocked sweat glands or hair follicles. Pimples may develop into deep sores that break open (rupture) and drain pus. Over time, affected areas of skin may thicken and become scarred. This condition is rare and does not spread from person to person (non-contagious). What are the causes? The exact cause of this condition is not known. It may be related to:  Female and female hormones.  An overactive disease-fighting system (immune system). The immune system may over-react to blocked hair follicles or sweat glands and cause swelling and pus-filled sores. What increases the risk? You are more likely to develop this condition if you:  Are female.  Are 46-55 years old.  Have a family history of hidradenitis suppurativa.  Have a personal history of acne.  Are overweight.  Smoke.  Take the medicine lithium. What are the signs or symptoms? The first symptoms are usually painful bumps in the skin, similar to pimples. The condition may get worse over time (progress), or it may only cause mild symptoms. If the disease progresses, symptoms may include:  Skin bumps getting bigger and growing deeper into the skin.  Bumps rupturing and draining pus.  Itchy, infected skin.  Skin getting thicker and scarred.  Tunnels under the skin (fistulas) where pus  drains from a bump.  Pain during daily activities, such as pain during walking if your groin area is affected.  Emotional problems, such as stress or depression. This condition may affect your appearance and your ability or willingness to wear certain clothes or do certain activities. How is this diagnosed? This condition is diagnosed by a health care provider who specializes in skin diseases (dermatologist). You may be diagnosed based on:  Your symptoms and medical history.  A physical exam.  Testing a pus sample for infection.  Blood tests. How is this treated? Your treatment will depend on how severe your symptoms are. The same treatment will not work for everybody with this condition. You may need to try several treatments to find what works best for you. Treatment may include:  Cleaning and bandaging (dressing) your wounds as needed.  Lifestyle changes, such as new skin care routines.  Taking medicines, such as: ? Antibiotics. ? Acne medicines. ? Medicines to reduce the activity of the immune system. ? A diabetes medicine (metformin). ? Birth control pills, for women. ? Steroids to reduce swelling and pain.  Working with a mental health care provider, if you experience emotional distress due to this condition. If you have severe symptoms that do not get better with medicine, you may need surgery. Surgery may involve:  Using a laser to clear the skin and remove hair follicles.  Opening and draining deep sores.  Removing the areas of skin that are diseased and scarred. Follow these instructions at home: Medicines   Take over-the-counter and prescription medicines only as  told by your health care provider.  If you were prescribed an antibiotic medicine, take it as told by your health care provider. Do not stop taking the antibiotic even if your condition improves. Skin care  If you have open wounds, cover them with a clean dressing as told by your health care  provider. Keep wounds clean by washing them gently with soap and water when you bathe.  Do not shave the areas where you get hidradenitis suppurativa.  Do not wear deodorant.  Wear loose-fitting clothes.  Try to avoid getting overheated or sweaty. If you get sweaty or wet, change into clean, dry clothes as soon as you can.  To help relieve pain and itchiness, cover sore areas with a warm, clean washcloth (warm compress) for 5-10 minutes as often as needed.  If told by your health care provider, take a bleach bath twice a week: ? Fill your bathtub halfway with water. ? Pour in  cup of unscented household bleach. ? Soak in the tub for 5-10 minutes. ? Only soak from the neck down. Avoid water on your face and hair. ? Shower to rinse off the bleach from your skin. General instructions  Learn as much as you can about your disease so that you have an active role in your treatment. Work closely with your health care provider to find treatments that work for you.  If you are overweight, work with your health care provider to lose weight as recommended.  Do not use any products that contain nicotine or tobacco, such as cigarettes and e-cigarettes. If you need help quitting, ask your health care provider.  If you struggle with living with this condition, talk with your health care provider or work with a mental health care provider as recommended.  Keep all follow-up visits as told by your health care provider. This is important. Where to find more information  Hidradenitis [place].: https://www.hs-foundation.org/ Contact a health care provider if you have:  A flare-up of hidradenitis suppurativa.  A fever or chills.  Trouble controlling your symptoms at home.  Trouble doing your daily activities because of your symptoms.  Trouble dealing with emotional problems related to your condition. Summary  Hidradenitis suppurativa is a long-term (chronic) skin disease.  It is similar to a severe form of acne, but it affects areas of the body where acne would be unusual.  The first symptoms are usually painful bumps in the skin, similar to pimples. The condition may get worse over time (progress), or it may only cause mild symptoms.  If you have open wounds, cover them with a clean dressing as told by your health care provider. Keep wounds clean by washing them gently with soap and water when you bathe.  Besides skin care, treatment may include medicines, laser treatment, and surgery. This information is not intended to replace advice given to you by your health care provider. Make sure you discuss any questions you have with your health care provider. Document Released: 08/16/2003 Document Revised: 01/09/2017 Document Reviewed: 01/09/2017 Elsevier Patient Education  2020 [employer].

## 2018-09-18 NOTE — Progress Notes (Signed)
Enaya Weyland is here today for a postoperative visit after undergoing excision of left groin hidradenitis.  She says that it seems to be healing well.  She has had progressively less pain in the area.  She has had just serosanguineous drainage on the dressings.  She has not had any fevers or chills.  Today's Vitals   09/18/18 1010  BP: 120/80  Pulse: (!) 111  Resp: 16  Temp: (!) 97.5 F (36.4 C)  TempSrc: Temporal  SpO2: 97%  Weight: 202 lb (91.6 kg)  Height: 5\' 9"  (1.753 m)  PainSc: 0-No pain   Body mass index is 29.83 kg/m. Focused exam: The dressing was removed to reveal that the surgical defect is almost completely filled in.  There is granulation tissue present on the surface.  Just lateral to the incision site, on the skin of her upper thigh, there does appear to be some candidiasis starting.  Impression and plan: Aania Bennette is doing well 2 weeks after excision of left groin hidradenitis.  She should continue to change her dressing daily and shower, allowing warm soapy water to flow over the site.  I have recommended that she apply [employer] or miconazole powder to the area of her upper thigh and inguinal fold where she is beginning to develop candidiasis.  I will see her back in another 2 weeks to confirm that she has healed completely.

## 2018-10-02 ENCOUNTER — Ambulatory Visit (INDEPENDENT_AMBULATORY_CARE_PROVIDER_SITE_OTHER): Payer: Managed Care, Other (non HMO) | Admitting: General Surgery

## 2018-10-02 ENCOUNTER — Other Ambulatory Visit: Payer: Self-pay

## 2018-10-02 ENCOUNTER — Encounter: Payer: Self-pay | Admitting: General Surgery

## 2018-10-02 ENCOUNTER — Telehealth: Payer: Self-pay | Admitting: *Deleted

## 2018-10-02 VITALS — BP 114/79 | HR 109 | Temp 97.7°F | Ht 69.0 in | Wt 200.0 lb

## 2018-10-02 DIAGNOSIS — L732 Hidradenitis suppurativa: Secondary | ICD-10-CM

## 2018-10-02 NOTE — Patient Instructions (Addendum)
You may notice black on your dressing this is normal.   Continue to change your dressing daily.  Follow up here in 1 week with Dr Celine Ahr and we will see when you can return to work.

## 2018-10-02 NOTE — Telephone Encounter (Signed)
Patient was seen in the office this morning.   She states she needs an extension on disability.   Paperwork has been received from the [employer] and placed in folder.   Patient states she needs this completed today and faxed back in.   The patient would like a call back on her cell phone once this has been completed and faxed.   Note routed to [employer] Clinical Pool.

## 2018-10-02 NOTE — Progress Notes (Signed)
Carla Payne is here today for a wound check.  She is a 33 year old woman who underwent wide local excision of hidradenitis suprativa on August 19.  The wound was left to heal by secondary intention.  I saw her in clinic on September 3.  The wound was healing nicely without evidence of infection.  She is here today for repeat evaluation.  She says that the area feels somewhat raw and tender to her, particularly when she walks.  There is been some pale yellowish drainage on the gauze when she changes her dressing.  She denies any fevers or chills.  Today's Vitals   10/02/18 1017  BP: 114/79  Pulse: (!) 109  Temp: 97.7 F (36.5 C)  SpO2: 97%  Weight: 200 lb (90.7 kg)  Height: 5\' 9"  (1.753 m)   Body mass index is 29.53 kg/m.  Focused examination of the surgical site reveals hypertrophic granulation tissue.  There is no surrounding erythema, induration, or purulent drainage.  Today I treated the granulation tissue with silver nitrate sticks.  I will see Carla Payne back next week to follow-up the healing of her wound.

## 2018-10-02 NOTE — Telephone Encounter (Signed)
Disability paper work completed and faxed to [employer]. Return to work date is tentatively set for 10/13/18. This may change when she is seen on 10/09/18.  Patient has been notified.

## 2018-10-03 ENCOUNTER — Encounter: Payer: Self-pay | Admitting: Obstetrics and Gynecology

## 2018-10-03 ENCOUNTER — Ambulatory Visit (INDEPENDENT_AMBULATORY_CARE_PROVIDER_SITE_OTHER): Payer: Managed Care, Other (non HMO) | Admitting: Obstetrics and Gynecology

## 2018-10-03 ENCOUNTER — Other Ambulatory Visit: Payer: Self-pay

## 2018-10-03 VITALS — BP 120/84 | Ht 69.0 in | Wt 202.0 lb

## 2018-10-03 DIAGNOSIS — Z01419 Encounter for gynecological examination (general) (routine) without abnormal findings: Secondary | ICD-10-CM

## 2018-10-03 DIAGNOSIS — Z3041 Encounter for surveillance of contraceptive pills: Secondary | ICD-10-CM

## 2018-10-03 DIAGNOSIS — Z113 Encounter for screening for infections with a predominantly sexual mode of transmission: Secondary | ICD-10-CM

## 2018-10-03 DIAGNOSIS — Z1151 Encounter for screening for human papillomavirus (HPV): Secondary | ICD-10-CM

## 2018-10-03 DIAGNOSIS — Z23 Encounter for immunization: Secondary | ICD-10-CM | POA: Diagnosis not present

## 2018-10-03 DIAGNOSIS — B9689 Other specified bacterial agents as the cause of diseases classified elsewhere: Secondary | ICD-10-CM

## 2018-10-03 DIAGNOSIS — Z124 Encounter for screening for malignant neoplasm of cervix: Secondary | ICD-10-CM

## 2018-10-03 DIAGNOSIS — N760 Acute vaginitis: Secondary | ICD-10-CM

## 2018-10-03 LAB — POCT WET PREP WITH KOH
Clue Cells Wet Prep HPF POC: POSITIVE
KOH Prep POC: POSITIVE — AB
Trichomonas, UA: NEGATIVE
Yeast Wet Prep HPF POC: NEGATIVE

## 2018-10-03 MED ORDER — METRONIDAZOLE 500 MG PO TABS: 500.0000 mg | ORAL_TABLET | Freq: Two times a day (BID) | ORAL | 0 refills | Status: AC

## 2018-10-03 MED ORDER — CLOTRIMAZOLE-BETAMETHASONE 1-0.05 % EX CREA: TOPICAL_CREAM | CUTANEOUS | 0 refills | Status: DC

## 2018-10-03 MED ORDER — FLUCONAZOLE 150 MG PO TABS: 150.0000 mg | ORAL_TABLET | Freq: Once | ORAL | 0 refills | Status: AC

## 2018-10-03 MED ORDER — DROSPIRENONE-ETHINYL ESTRADIOL 3-0.02 MG PO TABS: 1.0000 | ORAL_TABLET | Freq: Every day | ORAL | 3 refills | Status: DC

## 2018-10-03 NOTE — Progress Notes (Signed)
PCP:  Perrin Maltese, MD   Chief Complaint  Patient presents with  . Gynecologic Exam  . Immunizations    flu shot today  . [employer] Employee     HPI:      Ms. Carla Payne is a 33 y.o. [id number] who LMP was Patient's last menstrual period was 09/26/2018 (exact date)., presents today for her annual examination.  Her menses are usually monthly on OCPs, but not necessarily during placebo pills; lasting 7-14 days.  Dysmenorrhea mild, occurring first 1-2 days of flow. She does have intermenstrual bleeding without late/missed pills. Started OCPs last yr after nexplanon removal 11/19.  Sex activity: single partner, contraception - OCP (estrogen/progesterone). Last Pap: with PCP in past; hx of abn pap with neg colpo bx in distant past Hx of STDs: trichomonas, HPV  Pt was on abx a few months ago for HS lesion. Noticed vaginal itching/irritation before menses that is persisting. No meds to treat. Feels like area is "spreading". Hx of DM as well. No hx of BV.  There is no FH of breast cancer. There is no FH of ovarian cancer. The patient does not do self-breast exams.  Tobacco use: 1/2 ppd Alcohol use: none No drug use.  Exercise: not active  She does get adequate calcium and Vitamin D in her diet.   Past Medical History:  Diagnosis Date  . Anxiety   . 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 Diabetes mellitus without complication ([place]) 123456  . Dysrhythmia    rapid pulse  . Fatty liver   . Hidradenitis 2014  . Hypercholesteremia   . Hyperlipidemia   . Pilonidal cyst 2012    Past Surgical History:  Procedure Laterality Date  . AXILLARY HIDRADENITIS EXCISION  01-05-13  . axillary mass Left 2014 ,may  . BREAST CYST ASPIRATION Left 10/08/2017   2 abcesses removed  . BREAST CYST ASPIRATION Right 10/2017   abcess removed  . COLPOSCOPY  2014  . HYDRADENITIS EXCISION Bilateral 10/12/2014   Procedure: EXCISION HIDRADENITIS GROIN;  Surgeon: Robert Bellow, MD;  Location: [facility] ORS;   Service: General;  Laterality: Bilateral;  . HYDRADENITIS EXCISION Left 08/24/2016   Procedure: EXCISION HIDRADENITIS AXILLA;  Surgeon: Robert Bellow, MD;  Location: [facility] ORS;  Service: General;  Laterality: Left;  . HYDRADENITIS EXCISION Left 09/03/2018   Procedure: EXCISION HIDRADENITIS GROIN, LEFT, DIABETIC;  Surgeon: Fredirick Maudlin, MD;  Location: [facility] ORS;  Service: General;  Laterality: Left;  . PERINEAL HIDRADENITIS EXCISION    . WISDOM TOOTH EXTRACTION      Family History  Problem Relation Age of Onset  . Hypertension Father     Social History   Socioeconomic History  . Marital status: Single    Spouse name: Not on file  . Number of children: Not on file  . Years of education: Not on file  . Highest education level: Not on file  Occupational History  . Not on file  Social Needs  . Financial resource strain: Not on file  . Food insecurity    Worry: Not on file    Inability: Not on file  . Transportation needs    Medical: Not on file    Non-medical: Not on file  Tobacco Use  . Smoking status: Current Some Day Smoker    Packs/day: 0.50    Years: 13.00    Pack years: 6.50    Types: Cigarettes  . Smokeless tobacco: Never Used  Substance and Sexual Activity  .  Alcohol use: Yes    Comment: wine on weekends  . Drug use: No  . Sexual activity: Yes    Birth control/protection: Pill  Lifestyle  . Physical activity    Days per week: Not on file    Minutes per session: Not on file  . Stress: Not on file  Relationships  . Social Herbalist on phone: Not on file    Gets together: Not on file    Attends religious service: Not on file    Active member of club or organization: Not on file    Attends meetings of clubs or organizations: Not on file    Relationship status: Not on file  . Intimate partner violence    Fear of current or ex partner: Not on file    Emotionally abused: Not on file    Physically abused: Not on file    Forced sexual activity:  Not on file  Other Topics Concern  . Not on file  Social History Narrative  . Not on file     Current Outpatient Medications:  .  acetaminophen (TYLENOL) 500 MG tablet, Take 1,000 mg by mouth every 8 (eight) hours as needed (pain). , Disp: , Rfl:  .  Gauze Pads & Dressings (ABDOMINAL PAD) 8"X10" PADS, Apply 1 Units topically 2 (two) times daily., Disp: 30 each, Rfl: 2 .  Gauze Pads & Dressings 4-1/4"X4-1/4" PADS, Apply 1 Units topically 2 (two) times daily. Moisten with saline and place in wound., Disp: 2 Package, Rfl: 2 .  Insulin Glargine (TOUJEO MAX SOLOSTAR [place]), Inject 45 Units into the skin at bedtime. , Disp: , Rfl:  .  metFORMIN (GLUCOPHAGE) 500 MG tablet, Take 1,000 mg by mouth 2 (two) times daily with a meal. , Disp: , Rfl:  .  sodium chloride irrigation 0.9 % irrigation, Irrigate with 10 mLs as directed 2 (two) times daily. Moisten gauze with saline before applying to wound., Disp: 500 mL, Rfl: 1 .  clotrimazole-betamethasone (LOTRISONE) cream, Apply externally BID prn sx for 1- 2 wks, Disp: 45 g, Rfl: 0 .  drospirenone-ethinyl estradiol (YAZ) 3-0.02 MG tablet, Take 1 tablet by mouth daily., Disp: 3 Package, Rfl: 3 .  fluconazole (DIFLUCAN) 150 MG tablet, Take 1 tablet (150 mg total) by mouth once for 1 dose., Disp: 1 tablet, Rfl: 0 .  metroNIDAZOLE (FLAGYL) 500 MG tablet, Take 1 tablet (500 mg total) by mouth 2 (two) times daily for 7 days., Disp: 14 tablet, Rfl: 0     ROS:  Review of Systems  Constitutional: Negative for fatigue, fever and unexpected weight change.  Respiratory: Negative for cough, shortness of breath and wheezing.   Cardiovascular: Negative for chest pain, palpitations and leg swelling.  Gastrointestinal: Negative for blood in stool, constipation, diarrhea, nausea and vomiting.  Endocrine: Negative for cold intolerance, heat intolerance and polyuria.  Genitourinary: Positive for menstrual problem, vaginal discharge and vaginal pain. Negative for  dyspareunia, dysuria, flank pain, frequency, genital sores, hematuria, pelvic pain, urgency and vaginal bleeding.  Musculoskeletal: Negative for back pain, joint swelling and myalgias.  Skin: Negative for rash.  Neurological: Negative for dizziness, syncope, light-headedness, numbness and headaches.  Hematological: Negative for adenopathy.  Psychiatric/Behavioral: Negative for agitation, confusion, sleep disturbance and suicidal ideas. The patient is not nervous/anxious.    BREAST: No symptoms   Objective: BP 120/84   Ht 5\' 9"  (1.753 m)   Wt 202 lb (91.6 kg)   LMP 09/26/2018 (Exact Date)   BMI 29.83  kg/m    Physical Exam Constitutional:      Appearance: She is well-developed.  Genitourinary:     Vagina, cervix, uterus, right adnexa and left adnexa normal.     Vulval rash present.     No vulval lesion or tenderness noted.     No vaginal discharge, erythema or tenderness.     No cervical polyp.     Uterus is not enlarged or tender.     No right or left adnexal mass present.     Right adnexa not tender.     Left adnexa not tender.     Genitourinary Comments: BILAT LABIA MAJORA AND MINORA WITH ERYTHEMA AND SWELLING; ALSO IN PERINEAL AREA  Neck:     Musculoskeletal: Normal range of motion.     Thyroid: No thyromegaly.  Cardiovascular:     Rate and Rhythm: Normal rate and regular rhythm.     Heart sounds: Normal heart sounds. No murmur.  Pulmonary:     Effort: Pulmonary effort is normal.     Breath sounds: Normal breath sounds.  Chest:     Breasts:        Right: No mass, nipple discharge, skin change or tenderness.        Left: No mass, nipple discharge, skin change or tenderness.  Abdominal:     Palpations: Abdomen is soft.     Tenderness: There is no abdominal tenderness. There is no guarding.  Musculoskeletal: Normal range of motion.  Neurological:     General: No focal deficit present.     Mental Status: She is alert and oriented to person, place, and time.      Cranial Nerves: No cranial nerve deficit.  Skin:    General: Skin is warm and dry.  Psychiatric:        Mood and Affect: Mood normal.        Behavior: Behavior normal.        Thought Content: Thought content normal.        Judgment: Judgment normal.  Vitals signs reviewed.     Results: Results for orders placed or performed in visit on 10/03/18 (from the past 24 hour(s))  POCT Wet Prep with KOH     Status: Abnormal   Collection Time: 10/03/18  2:36 PM  Result Value Ref Range   Trichomonas, UA Negative    Clue Cells Wet Prep HPF POC pos    Epithelial Wet Prep HPF POC     Yeast Wet Prep HPF POC neg    Bacteria Wet Prep HPF POC     RBC Wet Prep HPF POC     WBC Wet Prep HPF POC     KOH Prep POC Positive (A) Negative    Assessment/Plan: Encounter for annual routine gynecological examination  Cervical cancer screening - Plan: IGP, Aptima HPV, IGP,CtNgTv,Apt HPV,rfx16/18,45  Screening for HPV (human papillomavirus) - Plan: IGP, Aptima HPV, IGP,CtNgTv,Apt HPV,rfx16/18,45  Screening for STD (sexually transmitted disease) - Plan: IGP,CtNgTv,Apt HPV,rfx16/18,45  Encounter for surveillance of contraceptive pills - Plan: drospirenone-ethinyl estradiol (YAZ) 3-0.02 MG tablet; Will change from lomedia to yaz due to irreg bleeding on OCPs. F/u prn.   Bacterial vaginosis - Plan: metroNIDAZOLE (FLAGYL) 500 MG tablet, POCT Wet Prep with KOH; pos wet prep. Rx flagyl. F/u prn. No EtOH use  Acute vaginitis - Plan: fluconazole (DIFLUCAN) 150 MG tablet, clotrimazole-betamethasone (LOTRISONE) cream, POCT Wet Prep with KOH; Neg wet prep for yeast, pos exam. Rx diflucan/lotrisone crm given severity of sx. F/u prn  Needs flu shot - Plan: Flu Vaccine QUAD 36+ mos IM (Fluarix, Quad PF)  Meds ordered this encounter  Medications  . metroNIDAZOLE (FLAGYL) 500 MG tablet    Sig: Take 1 tablet (500 mg total) by mouth 2 (two) times daily for 7 days.    Dispense:  14 tablet    Refill:  0    Order  Specific Question:   Supervising Provider    Answer:   CHARLIEANN, EVERARD [id number]  . fluconazole (DIFLUCAN) 150 MG tablet    Sig: Take 1 tablet (150 mg total) by mouth once for 1 dose.    Dispense:  1 tablet    Refill:  0    Order Specific Question:   Supervising Provider    Answer:   PAYZLEE, CARBON [id number]  . clotrimazole-betamethasone (LOTRISONE) cream    Sig: Apply externally BID prn sx for 1- 2 wks    Dispense:  45 g    Refill:  0    Order Specific Question:   Supervising Provider    Answer:   KHAMIL, PICKETT [id number]  . drospirenone-ethinyl estradiol (YAZ) 3-0.02 MG tablet    Sig: Take 1 tablet by mouth daily.    Dispense:  3 Package    Refill:  3    Order Specific Question:   Supervising Provider    Answer:   NYESHA, DOTT [id number]             GYN counsel adequate intake of calcium and vitamin D, diet and exercise     F/U  Return in about 1 year (around 10/03/2019).  Tawnia Schirm B. Abrielle Finck, PA-C 10/03/2018 2:39 PM

## 2018-10-03 NOTE — Patient Instructions (Signed)
I value your feedback and entrusting us with your care. If you get a  patient survey, I would appreciate you taking the time to let us know about your experience today. Thank you! 

## 2018-10-06 NOTE — Progress Notes (Signed)
Disability paperwork faxed to [employer] Fax: [phone]

## 2018-10-09 ENCOUNTER — Ambulatory Visit (INDEPENDENT_AMBULATORY_CARE_PROVIDER_SITE_OTHER): Payer: Managed Care, Other (non HMO) | Admitting: General Surgery

## 2018-10-09 ENCOUNTER — Encounter: Payer: Self-pay | Admitting: General Surgery

## 2018-10-09 ENCOUNTER — Other Ambulatory Visit: Payer: Self-pay

## 2018-10-09 VITALS — BP 143/107 | HR 121 | Temp 97.9°F | Ht 67.0 in | Wt 206.0 lb

## 2018-10-09 DIAGNOSIS — L732 Hidradenitis suppurativa: Secondary | ICD-10-CM

## 2018-10-09 LAB — IGP,CTNGTV,APT HPV,RFX16/18,45
Chlamydia, Nuc. Acid Amp: NEGATIVE
Gonococcus, Nuc. Acid Amp: NEGATIVE
HPV Aptima: NEGATIVE
Trich vag by NAA: NEGATIVE

## 2018-10-09 NOTE — Patient Instructions (Signed)
Patient to have surgery .

## 2018-10-09 NOTE — Progress Notes (Signed)
Carla Payne is here today for a wound check.  She is a 33 year old woman who has had multiple episodes of hidradenitis and multiple excisions for the same.  I excised left inguinal hidradenitis on 19 August.  We have been following her for wound healing and saw her a week ago.  There was hyper trophic granulation tissue present.  This was treated with topical silver nitrate.  Since that visit, she has had a new area become inflamed in her right groin.  It has been very painful but improved with some drainage that occurred overnight.  Today's Vitals   10/09/18 1341  BP: (!) 143/107  Pulse: (!) 121  Temp: 97.9 F (36.6 C)  TempSrc: Skin  SpO2: 98%  Weight: 206 lb (93.4 kg)  Height: 5\' 7"  (1.702 m)   Body mass index is 32.26 kg/m. Focused examination: The left inguinal area was examined.  The incision is healing well.  There is still some hypertrophic granulation tissue present.  In the right inguinal fold, there is a new area of inflamed hidradenitis.  There is no fluctuance or purulent drainage identified.  Impression and plan: Carla Payne is a 33 year old woman who has had multiple excisions of hidradenitis in the past.  Most recently, I excised left inguinal hidradenitis.  She has a new flare in the right inguinal fold.  I offered her the option of trying to treat conservatively with antibiotics versus resection and she prefers the latter option.  The risks of the procedure are essentially identical to those we discussed several weeks ago.  We will work on getting her scheduled for operative intervention.

## 2018-10-13 ENCOUNTER — Telehealth: Payer: Self-pay | Admitting: General Surgery

## 2018-10-13 NOTE — Telephone Encounter (Signed)
Spoke with patient. Unable to extend FMLA until next surgery of 11/07/2018.   [employer] FMLA paperwork completed and faxed to [phone] with return to work date 10/13/2018.

## 2018-10-13 NOTE — Telephone Encounter (Signed)
Pt has been advised of pre admission date/time, Covid Testing date and Surgery date.  Surgery Date: 11/07/18 With Dr Golden Pop hidradenitis-right inguinal fold.  Preadmission Testing Date: 10/29/18 between 8-1:00pm-phone interview.  Covid Testing Date: 11/04/18 between 8-10:30am - patient advised to go to the [place] [facility]  [employer] Video sent via [employer] and [employer].  Patient has been made aware to call [phone], between 1-3:00pm the day before surgery, to find out what time to arrive.

## 2018-10-13 NOTE — Telephone Encounter (Signed)
Patient has called and would like her FMLA to be extended out to her next surgery date. The FMLA has her out till today 10/13/18, however she feels that her wound is not fully healed from her last procedure. Her next surgery date is 11/07/18. Please call patient and discuss. Her most recent FMLA forms are scanned in the chart under media.

## 2018-10-14 ENCOUNTER — Telehealth: Payer: Self-pay | Admitting: General Surgery

## 2018-10-14 NOTE — Telephone Encounter (Signed)
Patient has called and needs a return to work note to return to work on 10/14/18. Please make this attention to [employer]. Please call patient once this is complete. She would like for this to be in her My Chart so that she can print this off for her employer.

## 2018-10-14 NOTE — Telephone Encounter (Signed)
Called patient and let her know that the letter has been sent to her My Chart.

## 2018-10-15 ENCOUNTER — Other Ambulatory Visit: Payer: Self-pay | Admitting: Surgery

## 2018-10-15 DIAGNOSIS — L732 Hidradenitis suppurativa: Secondary | ICD-10-CM

## 2018-10-21 ENCOUNTER — Telehealth: Payer: Self-pay | Admitting: *Deleted

## 2018-10-21 NOTE — Telephone Encounter (Signed)
Patient is scheduled for surgery on 11/07/18 with Dr.Cannon and wanted to get it reschedule to sometime in December.  Please call patient back

## 2018-10-22 ENCOUNTER — Telehealth: Payer: Self-pay | Admitting: General Surgery

## 2018-10-22 NOTE — Telephone Encounter (Signed)
Patient called and wanted to move her surgery date to December. I have rescheduled this and advised Dr Celine Ahr. Dr Celine Ahr stated that the patient did not have to have an office visit prior to surgery in December, that she would update the H&P the day of surgery. Surgery has been moved from 10/23 to 12/11.  Pt has been advised of pre admission date/time, Covid Testing date and Surgery date.  Surgery Date: 12/26/18 with Dr Lacretia Leigh of hidradenitis-inguinal.  Preadmission Testing Date: 12/19/18 between 8-1:00pm-phone interview.  Covid Testing Date: 12/23/18 between 8-10:30am - patient advised to go to the [place]  [employer] Video sent via [employer] and [employer].  Patient has been made aware to call [phone], between 1-3:00pm the day before surgery, to find out what time to arrive.

## 2018-10-29 ENCOUNTER — Other Ambulatory Visit: Payer: Managed Care, Other (non HMO)

## 2018-10-30 ENCOUNTER — Other Ambulatory Visit: Payer: Managed Care, Other (non HMO)

## 2018-11-04 ENCOUNTER — Ambulatory Visit (INDEPENDENT_AMBULATORY_CARE_PROVIDER_SITE_OTHER): Payer: Self-pay | Admitting: General Surgery

## 2018-11-04 ENCOUNTER — Other Ambulatory Visit: Payer: Self-pay

## 2018-11-04 ENCOUNTER — Encounter: Payer: Self-pay | Admitting: General Surgery

## 2018-11-04 ENCOUNTER — Other Ambulatory Visit: Payer: Managed Care, Other (non HMO)

## 2018-11-04 VITALS — BP 132/86 | HR 104 | Temp 97.7°F | Ht 69.0 in | Wt 202.0 lb

## 2018-11-04 DIAGNOSIS — L732 Hidradenitis suppurativa: Secondary | ICD-10-CM

## 2018-11-04 MED ORDER — CEPHALEXIN 500 MG PO CAPS: 500.0000 mg | ORAL_CAPSULE | Freq: Four times a day (QID) | ORAL | 0 refills | Status: AC

## 2018-11-04 MED ORDER — FLUCONAZOLE 100 MG PO TABS: 100.0000 mg | ORAL_TABLET | Freq: Every day | ORAL | 0 refills | Status: AC

## 2018-11-04 NOTE — Patient Instructions (Signed)
Continue antibiotic until gone. If you are not feeling better please call our office and let us know.

## 2018-11-04 NOTE — Progress Notes (Signed)
Carla Payne is a 33 year old woman who underwent wide local excision of left inguinal hidradenitis in August.  She has had some hypertrophic granulation tissue at the surgical site, treated with silver nitrate.  At her last office visit, on September 24, she pointed out a new area in the right inguinal fold.  We had arranged a surgical date, but she contacted our office and asked to reschedule this until December.  She called our office last week, requesting to be seen for a new area of active hidradenitis just below her left breast.  She says that it is painful, but did spontaneously drain.  The area is still indurated and she would like to have it evaluated.  She has not experienced any fevers or chills.  No nausea or vomiting.  She is taking ibuprofen and Tylenol, with moderate relief of her symptoms.  Past Medical History:  Diagnosis Date  . Anxiety   . 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 Diabetes mellitus without complication ([place]) 123456  . Dysrhythmia    rapid pulse  . Fatty liver   . Hidradenitis 2014  . Hypercholesteremia   . Hyperlipidemia   . Pilonidal cyst 2012   Past Surgical History:  Procedure Laterality Date  . AXILLARY HIDRADENITIS EXCISION  01-05-13  . axillary mass Left 2014 ,may  . BREAST CYST ASPIRATION Left 10/08/2017   2 abcesses removed  . BREAST CYST ASPIRATION Right 10/2017   abcess removed  . COLPOSCOPY  2014  . HYDRADENITIS EXCISION Bilateral 10/12/2014   Procedure: EXCISION HIDRADENITIS GROIN;  Surgeon: Robert Bellow, MD;  Location: [facility];  Service: General;  Laterality: Bilateral;  . HYDRADENITIS EXCISION Left 08/24/2016   Procedure: EXCISION HIDRADENITIS AXILLA;  Surgeon: Robert Bellow, MD;  Location: [facility];  Service: General;  Laterality: Left;  . HYDRADENITIS EXCISION Left 09/03/2018   Procedure: EXCISION HIDRADENITIS GROIN, LEFT, DIABETIC;  Surgeon: Fredirick Maudlin, MD;  Location: [facility];  Service: General;  Laterality: Left;  . PERINEAL  HIDRADENITIS EXCISION    . WISDOM TOOTH EXTRACTION     Family History  Problem Relation Age of Onset  . Hypertension Father    Social History   Tobacco Use  . Smoking status: Current Some Day Smoker    Packs/day: 0.50    Years: 13.00    Pack years: 6.50    Types: Cigarettes  . Smokeless tobacco: Never Used  Substance Use Topics  . Alcohol use: Yes    Comment: wine on weekends  . Drug use: No   Current Meds  Medication Sig  . acetaminophen (TYLENOL) 500 MG tablet Take 1,000 mg by mouth every 8 (eight) hours as needed (pain).   . clotrimazole-betamethasone (LOTRISONE) cream Apply externally BID prn sx for 1- 2 wks  . drospirenone-ethinyl estradiol (YAZ) 3-0.02 MG tablet Take 1 tablet by mouth daily.  . Gauze Pads & Dressings (ABDOMINAL PAD) 8"X10" PADS Apply 1 Units topically 2 (two) times daily.  . Gauze Pads & Dressings 4-1/4"X4-1/4" PADS Apply 1 Units topically 2 (two) times daily. Moisten with saline and place in wound.  . Insulin Glargine (TOUJEO MAX SOLOSTAR [place]) Inject 45 Units into the skin at bedtime.   . metFORMIN (GLUCOPHAGE) 500 MG tablet Take 1,000 mg by mouth 2 (two) times daily with a meal.   . sodium chloride irrigation 0.9 % irrigation Irrigate with 10 mLs as directed 2 (two) times daily. Moisten gauze with saline before applying to wound.   No Known Allergies  Today's Vitals   11/04/18 1402  BP: 132/86  Pulse: (!) 104  Temp: 97.7 F (36.5 C)  SpO2: 98%  Weight: 202 lb (91.6 kg)  Height: 5\' 9"  (1.753 m)   Body mass index is 29.83 kg/m. Physical Exam  Constitutional: She is oriented to person, place, and time. She appears well-developed and well-nourished.  HENT:  Head: Normocephalic and atraumatic.  Mouth and nose are covered with a mask secondary to COVID-19 precautions  Eyes: Right eye exhibits no discharge. Left eye exhibits no discharge. No scleral icterus.  Neck: No tracheal deviation present.  Cardiovascular:  Tachycardic, regular rhythm   Pulmonary/Chest: Effort normal. No stridor. No respiratory distress.    Rated and erythematous tissue in the left mammary fold near the midline.  There is no fluctuance appreciated.  There is a small amount of seropurulent drainage.  The area is tender to palpation.  Abdominal: Soft. Bowel sounds are normal.  Genitourinary:       Pelvic exam was performed with patient supine.     Genitourinary Comments: The previous site of excision in her left inguinal fold has healed and with the exception of a small, approximately 2 mm area of hypertrophic granulation tissue.  The right inguinal fold hidradenitis is quiescent.  There is no erythema, induration, or purulent drainage in the previously identified site.   Musculoskeletal:        General: No deformity or edema.  Neurological: She is alert and oriented to person, place, and time.  Psychiatric: She has a normal mood and affect. Her behavior is normal.   Impression and plan: This is a 33 year old woman who has had multiple recurrent episodes of hidradenitis.  I treated the hypertrophic granulation tissue in the left inguinal area with silver nitrate.  I prescribed a 10-day course of Keflex for the area in her inframammary fold on the left.  At this time, she prefers to avoid surgical excision.  If there is no improvement with antibiotics, she should contact us and we will discuss whether or not a more aggressive course of action should be taken.  At this time, the right inguinal fold lesions have cleared up and there is no indication for surgical intervention at this location.

## 2018-11-06 ENCOUNTER — Telehealth: Payer: Self-pay

## 2018-11-06 NOTE — Telephone Encounter (Signed)
Intermittent leave paperwork completed and faxed to [employer] at this time. Begin 10/29/18 through 04/29/2019

## 2018-11-07 NOTE — Telephone Encounter (Signed)
Paperwork completed and refaxed to [employer].

## 2018-11-07 NOTE — Telephone Encounter (Signed)
Patient is calling said her paperwork that was faxed in was missing some information on it, and asked if we could fix the paperwork and fax it back in, patient can be reached at [phone]. Please call and advise.

## 2018-11-11 ENCOUNTER — Telehealth: Payer: Self-pay | Admitting: *Deleted

## 2018-11-11 NOTE — Telephone Encounter (Signed)
Received FMLA paperwork on 11/05/18 for surgery 12/26/18, faxed on 11/11/18

## 2018-12-18 ENCOUNTER — Telehealth: Payer: Self-pay | Admitting: *Deleted

## 2018-12-18 NOTE — Telephone Encounter (Signed)
Patient stated that her hidradenitis in the groin area has cleared up a lot since she was seen here in the office on 11/04/18. She is scheduled for surgery on 12/26/18 and was wondering if there is still a need to proceed with it.

## 2018-12-18 NOTE — Telephone Encounter (Signed)
Spoke with patient and she informed me that the effected area has cleared up a lot and she would like to know if she needs to cancel her surgery. Per Dr.Cannon patient may cancel surgery if patient no longer has an effected area. Angie states she will cancel patient's surgery on scheduled for 12/26/18.

## 2018-12-19 ENCOUNTER — Other Ambulatory Visit: Admission: RE | Admit: 2018-12-19 | Payer: Medicaid Other | Source: Ambulatory Visit

## 2018-12-23 ENCOUNTER — Other Ambulatory Visit: Admission: RE | Admit: 2018-12-23 | Payer: Medicaid Other | Source: Ambulatory Visit

## 2018-12-26 ENCOUNTER — Ambulatory Visit: Admit: 2018-12-26 | Payer: Medicaid Other | Admitting: General Surgery

## 2018-12-26 SURGERY — EXCISION, HIDRADENITIS, INGUINAL REGION
Anesthesia: Choice | Laterality: Right

## 2019-08-21 DIAGNOSIS — I499 Cardiac arrhythmia, unspecified: Secondary | ICD-10-CM

## 2019-08-21 HISTORY — DX: Cardiac arrhythmia, unspecified: I49.9

## 2020-01-17 ENCOUNTER — Encounter: Payer: Self-pay | Admitting: Emergency Medicine

## 2020-01-17 ENCOUNTER — Emergency Department
Admission: EM | Admit: 2020-01-17 | Discharge: 2020-01-17 | Disposition: A | Discharge status: Home / Self Care | Payer: Medicaid Other | Attending: Emergency Medicine | Admitting: Emergency Medicine

## 2020-01-17 ENCOUNTER — Other Ambulatory Visit: Payer: Self-pay

## 2020-01-17 DIAGNOSIS — Z5321 Procedure and treatment not carried out due to patient leaving prior to being seen by health care provider: Secondary | ICD-10-CM | POA: Insufficient documentation

## 2020-01-17 DIAGNOSIS — J069 Acute upper respiratory infection, unspecified: Secondary | ICD-10-CM | POA: Insufficient documentation

## 2020-01-17 NOTE — ED Notes (Signed)
Pt reports she is leaving to go elsewhere because she needs a covid test and doesn't want to wait that long for one

## 2020-01-17 NOTE — ED Triage Notes (Signed)
Pt reports upper resp symptons since Thursday. Pt has been exposed to covid this am

## 2020-10-07 ENCOUNTER — Encounter: Payer: Self-pay | Admitting: General Surgery

## 2021-03-21 DIAGNOSIS — F419 Anxiety disorder, unspecified: Secondary | ICD-10-CM

## 2021-03-21 HISTORY — DX: Anxiety disorder, unspecified: F41.9

## 2021-06-19 DIAGNOSIS — E1142 Type 2 diabetes mellitus with diabetic polyneuropathy: Secondary | ICD-10-CM | POA: Insufficient documentation

## 2022-03-29 ENCOUNTER — Other Ambulatory Visit: Payer: Self-pay | Admitting: Family

## 2022-04-27 ENCOUNTER — Ambulatory Visit (INDEPENDENT_AMBULATORY_CARE_PROVIDER_SITE_OTHER): Payer: Medicaid Other | Admitting: Family

## 2022-04-27 ENCOUNTER — Encounter: Payer: Self-pay | Admitting: Family

## 2022-04-27 VITALS — BP 134/72 | HR 113 | Ht 69.0 in | Wt 188.4 lb

## 2022-04-27 DIAGNOSIS — F411 Generalized anxiety disorder: Secondary | ICD-10-CM | POA: Diagnosis not present

## 2022-04-27 DIAGNOSIS — F419 Anxiety disorder, unspecified: Secondary | ICD-10-CM

## 2022-04-27 DIAGNOSIS — E538 Deficiency of other specified B group vitamins: Secondary | ICD-10-CM

## 2022-04-27 DIAGNOSIS — I472 Ventricular tachycardia, unspecified: Secondary | ICD-10-CM

## 2022-04-27 DIAGNOSIS — E1165 Type 2 diabetes mellitus with hyperglycemia: Secondary | ICD-10-CM

## 2022-04-27 DIAGNOSIS — E782 Mixed hyperlipidemia: Secondary | ICD-10-CM

## 2022-04-27 DIAGNOSIS — E559 Vitamin D deficiency, unspecified: Secondary | ICD-10-CM | POA: Diagnosis not present

## 2022-04-27 DIAGNOSIS — I10 Essential (primary) hypertension: Secondary | ICD-10-CM

## 2022-04-27 DIAGNOSIS — K219 Gastro-esophageal reflux disease without esophagitis: Secondary | ICD-10-CM | POA: Insufficient documentation

## 2022-04-27 DIAGNOSIS — B009 Herpesviral infection, unspecified: Secondary | ICD-10-CM | POA: Insufficient documentation

## 2022-04-27 DIAGNOSIS — E1142 Type 2 diabetes mellitus with diabetic polyneuropathy: Secondary | ICD-10-CM

## 2022-04-27 DIAGNOSIS — K2100 Gastro-esophageal reflux disease with esophagitis, without bleeding: Secondary | ICD-10-CM

## 2022-04-27 DIAGNOSIS — R5383 Other fatigue: Secondary | ICD-10-CM

## 2022-04-27 LAB — POCT CBG (FASTING - GLUCOSE)-MANUAL ENTRY: Glucose Fasting, POC: 122 mg/dL — AB (ref 70–99)

## 2022-04-27 MED ORDER — ESCITALOPRAM OXALATE 10 MG PO TABS: 10.0000 mg | ORAL_TABLET | Freq: Every day | ORAL | 1 refills | Status: DC

## 2022-04-27 MED ORDER — FEXOFENADINE HCL 180 MG PO TABS: 180.0000 mg | ORAL_TABLET | Freq: Every day | ORAL | 1 refills | Status: AC

## 2022-04-27 NOTE — Progress Notes (Unsigned)
Established Patient Office Visit  Subjective:  Patient ID: Carla Payne, female    DOB: 1985/01/20  Age: 37 y.o. MRN: [id number]  Chief Complaint  Patient presents with   Follow-up    4 month follow up    Patient is here today for her 4 months follow up.  She has been feeling well since last appointment.   She does have additional concerns to discuss today.  Needs [place] to complete her FMLA paperwork.   Labs are due today. She needs refills.   I have reviewed her active problem list, medication list, allergies, family history, notes from last encounter, lab results for her appointment today.    No other concerns at this time.   Past Medical History:  Diagnosis Date   Anxiety 03/21/2021   Cervical dysplasia 2014   CINI   Diabetes mellitus without complication 2014   Dysrhythmia 08/21/2019   rapid pulse   Fatty liver 01/11/2016   Hidradenitis 2014   Hypercholesteremia 06/30/2012   Hyperlipidemia 06/30/2012   Pilonidal cyst 2012    Past Surgical History:  Procedure Laterality Date   AXILLARY HIDRADENITIS EXCISION  01-05-13   axillary mass Left 2014 ,may   BREAST CYST ASPIRATION Left 10/08/2017   2 abcesses removed   BREAST CYST ASPIRATION Right 10/2017   abcess removed   COLPOSCOPY  2014   HYDRADENITIS EXCISION Bilateral 10/12/2014   Procedure: EXCISION HIDRADENITIS GROIN;  Surgeon: [name], MD;  Location: [facility];  Service: General;  Laterality: Bilateral;   HYDRADENITIS EXCISION Left 08/24/2016   Procedure: EXCISION HIDRADENITIS AXILLA;  Surgeon: [name], MD;  Location: [facility];  Service: General;  Laterality: Left;   HYDRADENITIS EXCISION Left 09/03/2018   Procedure: EXCISION HIDRADENITIS GROIN, LEFT, DIABETIC;  Surgeon: Duanne Guess, MD;  Location: [facility];  Service: General;  Laterality: Left;   PERINEAL HIDRADENITIS EXCISION     WISDOM TOOTH EXTRACTION      Social History   Socioeconomic History   Marital status: Single     Spouse name: Not on file   Number of children: Not on file   Years of education: Not on file   Highest education level: Not on file  Occupational History   Not on file  Tobacco Use   Smoking status: Some Days    Packs/day: 0.50    Years: 13.00    Additional pack years: 0.00    Total pack years: 6.50    Types: Cigarettes   Smokeless tobacco: Never  Vaping Use   Vaping Use: Never used  Substance and Sexual Activity   Alcohol use: Yes    Comment: wine on weekends   Drug use: No   Sexual activity: Yes    Birth control/protection: Pill  Other Topics Concern   Not on file  Social History Narrative   Not on file   Social Determinants of Health   Financial Resource Strain: Not on file  Food Insecurity: Not on file  Transportation Needs: Not on file  Physical Activity: Not on file  Stress: Not on file  Social Connections: Not on file  Intimate Partner Violence: Not on file    Family History  Problem Relation Age of Onset   Hypertension Father     No Known Allergies  Review of Systems  All other systems reviewed and are negative.      Objective:   BP 134/72   Pulse (!) 113   Ht  (1.753 m)   Wt 188  lb 6.4 oz (85.5 kg)   SpO2 98%   BMI 27.82 kg/m   Vitals:   04/27/22 1426  BP: 134/72  Pulse: (!) 113  Height: 5\' 9"  (1.753 m)  Weight: 188 lb 6.4 oz (85.5 kg)  SpO2: 98%  BMI (Calculated): 27.81    Physical Exam Vitals and nursing note reviewed.  Constitutional:      Appearance: Normal appearance. She is normal weight.  HENT:     Head: Normocephalic.  Eyes:     Pupils: Pupils are equal, round, and reactive to light.  Cardiovascular:     Rate and Rhythm: Normal rate.  Pulmonary:     Effort: Pulmonary effort is normal.  Neurological:     Mental Status: She is alert.     Sensory: Sensory deficit present.  Psychiatric:        Behavior: Behavior normal.        Thought Content: Thought content normal.        Judgment: Judgment normal.       Results for orders placed or performed in visit on 04/27/22  Lipid panel  Result Value Ref Range   Cholesterol, Total 193 100 - 199 mg/dL   Triglycerides 696 (H) 0 - 149 mg/dL   HDL 66 >78 mg/dL   VLDL Cholesterol Cal 26 5 - 40 mg/dL   LDL Chol Calc (NIH) 938 (H) 0 - 99 mg/dL   Chol/HDL Ratio 2.9 0.0 - 4.4 ratio  VITAMIN D 25 Hydroxy (Vit-D Deficiency, Fractures)  Result Value Ref Range   Vit D, 25-Hydroxy 39.5 30.0 - 100.0 ng/mL  CBC With Differential  Result Value Ref Range   WBC 10.8 3.4 - 10.8 x10E3/uL   RBC 4.17 3.77 - 5.28 x10E6/uL   Hemoglobin 11.6 11.1 - 15.9 g/dL   Hematocrit 10.1 75.1 - 46.6 %   MCV 86 79 - 97 fL   MCH 27.8 26.6 - 33.0 pg   MCHC 32.4 31.5 - 35.7 g/dL   RDW 02.5 85.2 - 77.8 %   Neutrophils 55 Not Estab. %   Lymphs 34 Not Estab. %   Monocytes 7 Not Estab. %   Eos 3 Not Estab. %   Basos 1 Not Estab. %   Neutrophils Absolute 6.0 1.4 - 7.0 x10E3/uL   Lymphocytes Absolute 3.7 (H) 0.7 - 3.1 x10E3/uL   Monocytes Absolute 0.7 0.1 - 0.9 x10E3/uL   EOS (ABSOLUTE) 0.3 0.0 - 0.4 x10E3/uL   Basophils Absolute 0.1 0.0 - 0.2 x10E3/uL   Immature Granulocytes 0 Not Estab. %   Immature Grans (Abs) 0.0 0.0 - 0.1 x10E3/uL  CMP14+EGFR  Result Value Ref Range   Glucose 80 70 - 99 mg/dL   BUN 6 6 - 20 mg/dL   Creatinine, Ser 2.42 0.57 - 1.00 mg/dL   eGFR 353 >61 WE/RXV/4.00   BUN/Creatinine Ratio 9 9 - 23   Sodium 136 134 - 144 mmol/L   Potassium 4.1 3.5 - 5.2 mmol/L   Chloride 101 96 - 106 mmol/L   CO2 20 20 - 29 mmol/L   Calcium 9.8 8.7 - 10.2 mg/dL   Total Protein 7.4 6.0 - 8.5 g/dL   Albumin 4.3 3.9 - 4.9 g/dL   Globulin, Total 3.1 1.5 - 4.5 g/dL   Albumin/Globulin Ratio 1.4 1.2 - 2.2   Bilirubin Total 0.6 0.0 - 1.2 mg/dL   Alkaline Phosphatase 60 44 - 121 IU/L   AST 22 0 - 40 IU/L   ALT 19 0 - 32 IU/L  TSH  Result Value Ref Range   TSH 2.290 0.450 - 4.500 uIU/mL  Hemoglobin A1c  Result Value Ref Range   Hgb A1c MFr Bld 5.6 4.8 - 5.6 %    Est. average glucose Bld gHb Est-mCnc 114 mg/dL  Vitamin W09  Result Value Ref Range   Vitamin B-12 378 232 - 1,245 pg/mL  Iron, TIBC and Ferritin Panel  Result Value Ref Range   Total Iron Binding Capacity 389 250 - 450 ug/dL   UIBC 811 914 - 782 ug/dL   Iron 87 27 - 956 ug/dL   Iron Saturation 22 15 - 55 %   Ferritin 202 (H) 15 - 150 ng/mL  POCT CBG (Fasting - Glucose)  Result Value Ref Range   Glucose Fasting, POC 122 (A) 70 - 99 mg/dL       Assessment & Plan:   Problem List Items Addressed This Visit     Type 2 diabetes mellitus with hyperglycemia - Primary (Chronic)    Continue current therapy - patient has been well controlled with current regimen.  Labs today - will adjust medications as needed.        Relevant Medications   empagliflozin (JARDIANCE) 10 MG TABS tablet   OZEMPIC, 1 MG/DOSE, 4 MG/3ML SOPN   losartan (COZAAR) 25 MG tablet   Other Relevant Orders   POCT CBG (Fasting - Glucose) (Completed)   CBC With Differential (Completed)   CMP14+EGFR (Completed)   Hemoglobin A1c (Completed)   Generalized anxiety disorder    Continue increased lexapro dosage. . Patient appears to be doing well with this.   Will reassess at follow up appointment.       Relevant Medications   escitalopram (LEXAPRO) 5 MG tablet   escitalopram (LEXAPRO) 10 MG tablet   Other Relevant Orders   CBC With Differential (Completed)   CMP14+EGFR (Completed)   Hyperlipidemia (Chronic)    Checking labs today.  Continue current therapy for lipid control. Will modify as needed based on labwork results.        Relevant Medications   metoprolol succinate (TOPROL-XL) 25 MG 24 hr tablet   losartan (COZAAR) 25 MG tablet   Other Relevant Orders   Lipid panel (Completed)   CBC With Differential (Completed)   CMP14+EGFR (Completed)   Anxiety   Relevant Medications   escitalopram (LEXAPRO) 5 MG tablet   escitalopram (LEXAPRO) 10 MG tablet   Dysrhythmia   Relevant Medications    metoprolol succinate (TOPROL-XL) 25 MG 24 hr tablet   losartan (COZAAR) 25 MG tablet   Polyneuropathy due to type 2 diabetes mellitus   Relevant Medications   baclofen (LIORESAL) 10 MG tablet   empagliflozin (JARDIANCE) 10 MG TABS tablet   escitalopram (LEXAPRO) 5 MG tablet   OZEMPIC, 1 MG/DOSE, 4 MG/3ML SOPN   losartan (COZAAR) 25 MG tablet   escitalopram (LEXAPRO) 10 MG tablet   Vitamin D deficiency    Checking labs today.  Will continue supplements as needed.        GERD (gastroesophageal reflux disease) (Chronic)   Other Visit Diagnoses     Vitamin D deficiency, unspecified       Relevant Orders   VITAMIN D 25 Hydroxy (Vit-D Deficiency, Fractures) (Completed)   CBC With Differential (Completed)   CMP14+EGFR (Completed)   B12 deficiency due to diet       Relevant Orders   CBC With Differential (Completed)   CMP14+EGFR (Completed)   Vitamin B12 (Completed)   Essential hypertension, benign  Relevant Medications   metoprolol succinate (TOPROL-XL) 25 MG 24 hr tablet   losartan (COZAAR) 25 MG tablet   Other Relevant Orders   CBC With Differential (Completed)   CMP14+EGFR (Completed)   Other fatigue       Relevant Orders   CBC With Differential (Completed)   CMP14+EGFR (Completed)   TSH (Completed)   Iron, TIBC and Ferritin Panel (Completed)     Checking labs today - will discuss in detail at follow up appointment.    Return in about 2 weeks (around 05/11/2022) for F/U.   Total time spent: 30 minutes  Miki Kins, FNP  04/27/2022

## 2022-04-28 ENCOUNTER — Encounter: Payer: Self-pay | Admitting: Family

## 2022-04-28 LAB — CMP14+EGFR
ALT: 19 IU/L (ref 0–32)
AST: 22 IU/L (ref 0–40)
Albumin/Globulin Ratio: 1.4 (ref 1.2–2.2)
Albumin: 4.3 g/dL (ref 3.9–4.9)
Alkaline Phosphatase: 60 IU/L (ref 44–121)
BUN/Creatinine Ratio: 9 (ref 9–23)
BUN: 6 mg/dL (ref 6–20)
Bilirubin Total: 0.6 mg/dL (ref 0.0–1.2)
CO2: 20 mmol/L (ref 20–29)
Calcium: 9.8 mg/dL (ref 8.7–10.2)
Chloride: 101 mmol/L (ref 96–106)
Creatinine, Ser: 0.68 mg/dL (ref 0.57–1.00)
Globulin, Total: 3.1 g/dL (ref 1.5–4.5)
Glucose: 80 mg/dL (ref 70–99)
Potassium: 4.1 mmol/L (ref 3.5–5.2)
Sodium: 136 mmol/L (ref 134–144)
Total Protein: 7.4 g/dL (ref 6.0–8.5)
eGFR: 115 mL/min/{1.73_m2} (ref >59)

## 2022-04-28 LAB — CBC WITH DIFFERENTIAL
Basophils Absolute: 0.1 10*3/uL (ref 0.0–0.2)
Basos: 1 %
EOS (ABSOLUTE): 0.3 10*3/uL (ref 0.0–0.4)
Eos: 3 %
Hematocrit: 35.8 % (ref 34.0–46.6)
Hemoglobin: 11.6 g/dL (ref 11.1–15.9)
Immature Grans (Abs): 0 10*3/uL (ref 0.0–0.1)
Immature Granulocytes: 0 %
Lymphocytes Absolute: 3.7 10*3/uL — ABNORMAL HIGH (ref 0.7–3.1)
Lymphs: 34 %
MCH: 27.8 pg (ref 26.6–33.0)
MCHC: 32.4 g/dL (ref 31.5–35.7)
MCV: 86 fL (ref 79–97)
Monocytes Absolute: 0.7 10*3/uL (ref 0.1–0.9)
Monocytes: 7 %
Neutrophils Absolute: 6 10*3/uL (ref 1.4–7.0)
Neutrophils: 55 %
RBC: 4.17 x10E6/uL (ref 3.77–5.28)
RDW: 12.8 % (ref 11.7–15.4)
WBC: 10.8 10*3/uL (ref 3.4–10.8)

## 2022-04-28 LAB — VITAMIN B12: Vitamin B-12: 378 pg/mL (ref 232–1245)

## 2022-04-28 LAB — HEMOGLOBIN A1C
Est. average glucose Bld gHb Est-mCnc: 114 mg/dL
Hgb A1c MFr Bld: 5.6 % (ref 4.8–5.6)

## 2022-04-28 LAB — LIPID PANEL
Chol/HDL Ratio: 2.9 ratio (ref 0.0–4.4)
Cholesterol, Total: 193 mg/dL (ref 100–199)
HDL: 66 mg/dL (ref >39)
LDL Chol Calc (NIH): 101 mg/dL — ABNORMAL HIGH (ref 0–99)
Triglycerides: 152 mg/dL — ABNORMAL HIGH (ref 0–149)
VLDL Cholesterol Cal: 26 mg/dL (ref 5–40)

## 2022-04-28 LAB — VITAMIN D 25 HYDROXY (VIT D DEFICIENCY, FRACTURES): Vit D, 25-Hydroxy: 39.5 ng/mL (ref 30.0–100.0)

## 2022-04-28 LAB — TSH: TSH: 2.29 u[IU]/mL (ref 0.450–4.500)

## 2022-04-28 LAB — IRON,TIBC AND FERRITIN PANEL
Ferritin: 202 ng/mL — ABNORMAL HIGH (ref 15–150)
Iron Saturation: 22 % (ref 15–55)
Iron: 87 ug/dL (ref 27–159)
Total Iron Binding Capacity: 389 ug/dL (ref 250–450)
UIBC: 302 ug/dL (ref 131–425)

## 2022-04-28 NOTE — Assessment & Plan Note (Signed)
Checking labs today.  Will continue supplements as needed.  

## 2022-04-28 NOTE — Assessment & Plan Note (Signed)
Checking labs today.  Continue current therapy for lipid control. Will modify as needed based on labwork results.  

## 2022-04-28 NOTE — Assessment & Plan Note (Signed)
Continue increased lexapro dosage. . Patient appears to be doing well with this.   Will reassess at follow up appointment.

## 2022-04-28 NOTE — Assessment & Plan Note (Signed)
Continue current therapy - patient has been well controlled with current regimen.  Labs today - will adjust medications as needed.

## 2022-05-03 ENCOUNTER — Telehealth: Payer: Self-pay

## 2022-05-03 NOTE — Telephone Encounter (Signed)
Pt called regarding issue with bleeding, she said she mentioned it at appt & was told to call back if it hasn't stopped and you would send rx for her to help. Said she has been bleeding heavily for about 3 weeks now, asked about rx to help stop the bleeding please advise  *she also asked about her lab results

## 2022-05-11 ENCOUNTER — Ambulatory Visit: Payer: Medicaid Other | Admitting: Family

## 2022-05-14 ENCOUNTER — Ambulatory Visit: Payer: Medicaid Other | Admitting: Family

## 2022-05-14 ENCOUNTER — Encounter: Payer: Self-pay | Admitting: Family

## 2022-05-14 VITALS — BP 140/88 | HR 100 | Ht 69.0 in | Wt 188.0 lb

## 2022-05-14 DIAGNOSIS — D473 Essential (hemorrhagic) thrombocythemia: Secondary | ICD-10-CM

## 2022-05-14 DIAGNOSIS — I10 Essential (primary) hypertension: Secondary | ICD-10-CM | POA: Diagnosis not present

## 2022-05-14 DIAGNOSIS — E559 Vitamin D deficiency, unspecified: Secondary | ICD-10-CM

## 2022-05-14 DIAGNOSIS — E538 Deficiency of other specified B group vitamins: Secondary | ICD-10-CM | POA: Diagnosis not present

## 2022-05-14 DIAGNOSIS — E1165 Type 2 diabetes mellitus with hyperglycemia: Secondary | ICD-10-CM | POA: Diagnosis not present

## 2022-05-14 DIAGNOSIS — E1142 Type 2 diabetes mellitus with diabetic polyneuropathy: Secondary | ICD-10-CM

## 2022-05-14 DIAGNOSIS — E782 Mixed hyperlipidemia: Secondary | ICD-10-CM

## 2022-05-14 DIAGNOSIS — F419 Anxiety disorder, unspecified: Secondary | ICD-10-CM

## 2022-05-14 LAB — POC CREATINE & ALBUMIN,URINE
Creatinine, POC: 200 mg/dL
Microalbumin Ur, POC: 150 mg/L

## 2022-05-14 LAB — POCT CBG (FASTING - GLUCOSE)-MANUAL ENTRY: Glucose Fasting, POC: 160 mg/dL — AB (ref 70–99)

## 2022-05-14 MED ORDER — CYANOCOBALAMIN 1000 MCG/ML IJ SOLN
1000.0000 ug | Freq: Once | INTRAMUSCULAR | Status: AC
Start: 2022-05-14 — End: 2022-05-14
  Administered 2022-05-14: 1000 ug via INTRAMUSCULAR

## 2022-05-19 ENCOUNTER — Encounter: Payer: Self-pay | Admitting: Family

## 2022-05-19 DIAGNOSIS — E538 Deficiency of other specified B group vitamins: Secondary | ICD-10-CM | POA: Insufficient documentation

## 2022-05-19 DIAGNOSIS — I10 Essential (primary) hypertension: Secondary | ICD-10-CM

## 2022-05-19 DIAGNOSIS — D473 Essential (hemorrhagic) thrombocythemia: Secondary | ICD-10-CM

## 2022-05-19 HISTORY — DX: Essential (primary) hypertension: I10

## 2022-05-19 HISTORY — DX: Essential (hemorrhagic) thrombocythemia: D47.3

## 2022-05-19 NOTE — Assessment & Plan Note (Signed)
Labs look good,  B12 is low, will give her injection today.   If continues having symptoms, will increase Gabapentin dose.  Recheck at follow up.

## 2022-05-19 NOTE — Assessment & Plan Note (Signed)
Continue current therapy for lipid control. Reiterated changes to diet/exercise regimen to help with cholesterol control.  Patient was well controlled without medications previously, have reiterated that she needs to maintain dietary control, since she does not want to start medications.

## 2022-05-19 NOTE — Assessment & Plan Note (Signed)
Vitamin D still low.  Will continue supplements as needed.   Recheck at 3 month.

## 2022-05-19 NOTE — Assessment & Plan Note (Addendum)
Patient was released in 2018 from Hematology follow up.  She has had normal CBC's as of late.   Resolving problem.

## 2022-05-19 NOTE — Progress Notes (Signed)
Established Patient Office Visit  Subjective:  Patient ID: Carla Payne, female    DOB: [birth year]  Age: 37 y.o. MRN: [id number]  Chief Complaint  Patient presents with   Follow-up    2 week follow up    Patient is here for her 2 week follow up.  She says that her anxiety has continued, but it is slightly better.   She says that otherwise, she is feeling well.   Labs were done at last appointment, so we will review today.  Her A1C is great, and her other labs look pretty good as well.   She is still having numbness and tingling in her left leg, significantly enough that she says it is bothering her all the time.   Needs refills.   No other concerns at this time.   Past Medical History:  Diagnosis Date   Anxiety 03/21/2021   Cervical dysplasia 2014   CINI   Diabetes mellitus without complication (HCC) 2014   Dysrhythmia 08/21/2019   rapid pulse   Essential (hemorrhagic) thrombocythemia (HCC) 05/19/2022   Essential hypertension, benign 05/19/2022   Fatty liver 01/11/2016   Hidradenitis 2014   Hypercholesteremia 06/30/2012   Hyperlipidemia 06/30/2012   Pilonidal cyst 2012    Past Surgical History:  Procedure Laterality Date   AXILLARY HIDRADENITIS EXCISION  01-05-13   axillary mass Left 2014 ,may   BREAST CYST ASPIRATION Left 10/08/2017   2 abcesses removed   BREAST CYST ASPIRATION Right 10/2017   abcess removed   COLPOSCOPY  2014   HYDRADENITIS EXCISION Bilateral 10/12/2014   Procedure: EXCISION HIDRADENITIS GROIN;  Surgeon: [name], MD;  Location: [facility];  Service: General;  Laterality: Bilateral;   HYDRADENITIS EXCISION Left 08/24/2016   Procedure: EXCISION HIDRADENITIS AXILLA;  Surgeon: [name], MD;  Location: [facility];  Service: General;  Laterality: Left;   HYDRADENITIS EXCISION Left 09/03/2018   Procedure: EXCISION HIDRADENITIS GROIN, LEFT, DIABETIC;  Surgeon: Duanne Guess, MD;  Location: [facility];  Service: General;   Laterality: Left;   PERINEAL HIDRADENITIS EXCISION     WISDOM TOOTH EXTRACTION      Social History   Socioeconomic History   Marital status: Single    Spouse name: Not on file   Number of children: Not on file   Years of education: Not on file   Highest education level: Not on file  Occupational History   Not on file  Tobacco Use   Smoking status: Some Days    Packs/day: 0.50    Years: 13.00    Additional pack years: 0.00    Total pack years: 6.50    Types: Cigarettes   Smokeless tobacco: Never  Vaping Use   Vaping Use: Never used  Substance and Sexual Activity   Alcohol use: Yes    Comment: wine on weekends   Drug use: No   Sexual activity: Yes    Birth control/protection: Pill  Other Topics Concern   Not on file  Social History Narrative   Not on file   Social Determinants of Health   Financial Resource Strain: Not on file  Food Insecurity: Not on file  Transportation Needs: Not on file  Physical Activity: Not on file  Stress: Not on file  Social Connections: Not on file  Intimate Partner Violence: Not on file    Family History  Problem Relation Age of Onset   Hypertension Father     No Known Allergies  Review of Systems  Neurological:  Positive for tingling (and numbness in left leg.).  All other systems reviewed and are negative.      Objective:   BP (!) 140/88   Pulse 100   Ht 5\' 9"  (1.753 m)   Wt 188 lb (85.3 kg)   SpO2 98%   BMI 27.76 kg/m   Vitals:   05/14/22 1343  BP: (!) 140/88  Pulse: 100  Height: 5\' 9"  (1.753 m)  Weight: 188 lb (85.3 kg)  SpO2: 98%  BMI (Calculated): 27.75    Physical Exam Vitals and nursing note reviewed.  Constitutional:      Appearance: Normal appearance. She is normal weight.  HENT:     Head: Normocephalic.  Eyes:     Pupils: Pupils are equal, round, and reactive to light.  Cardiovascular:     Rate and Rhythm: Normal rate.  Pulmonary:     Effort: Pulmonary effort is normal.  Neurological:      General: No focal deficit present.     Mental Status: She is alert and oriented to person, place, and time. Mental status is at baseline.     Sensory: Sensory deficit (left leg sensation decreased.) present.  Psychiatric:        Attention and Perception: Attention normal.        Mood and Affect: Mood and affect normal.        Speech: Speech normal.        Behavior: Behavior normal. Behavior is cooperative.        Thought Content: Thought content normal.        Cognition and Memory: Cognition and memory normal.        Judgment: Judgment normal.      Results for orders placed or performed in visit on 05/14/22  POCT CBG (Fasting - Glucose)  Result Value Ref Range   Glucose Fasting, POC 160 (A) 70 - 99 mg/dL  POC CREATINE & ALBUMIN,URINE  Result Value Ref Range   Microalbumin Ur, POC 150 mg/L   Creatinine, POC 200 mg/dL   Albumin/Creatinine Ratio, Urine, POC 30-300     Recent Results (from the past 2160 hour(s))  POCT CBG (Fasting - Glucose)     Status: Abnormal   Collection Time: 04/27/22  2:31 PM  Result Value Ref Range   Glucose Fasting, POC 122 (A) 70 - 99 mg/dL  Lipid panel     Status: Abnormal   Collection Time: 04/27/22  3:03 PM  Result Value Ref Range   Cholesterol, Total 193 100 - 199 mg/dL   Triglycerides 604 (H) 0 - 149 mg/dL   HDL 66 >54 mg/dL   VLDL Cholesterol Cal 26 5 - 40 mg/dL   LDL Chol Calc (NIH) 098 (H) 0 - 99 mg/dL   Chol/HDL Ratio 2.9 0.0 - 4.4 ratio    Comment:                                   T. Chol/HDL Ratio                                             Men  Women                               1/2 Avg.Risk  3.4    3.3                                   Avg.Risk  5.0    4.4                                2X Avg.Risk  9.6    7.1                                3X Avg.Risk 23.4   11.0   VITAMIN D 25 Hydroxy (Vit-D Deficiency, Fractures)     Status: None   Collection Time: 04/27/22  3:03 PM  Result Value Ref Range   Vit D, 25-Hydroxy 39.5 30.0 - 100.0  ng/mL    Comment: Vitamin D deficiency has been defined by the Institute of Medicine and an Endocrine Society practice guideline as a level of serum 25-OH vitamin D less than 20 ng/mL (1,2). The Endocrine Society went on to further define vitamin D insufficiency as a level between 21 and 29 ng/mL (2). 1. IOM (Institute of Medicine). 2010. Dietary reference    intakes for calcium and D. [place]: The    [employer]. 2. Holick MF, Binkley Hale, Bischoff-Ferrari HA, et al.    Evaluation, treatment, and prevention of vitamin D    deficiency: an Endocrine Society clinical practice    guideline. JCEM. 2011 Jul; 96(7):1911-30.   CBC With Differential     Status: Abnormal   Collection Time: 04/27/22  3:03 PM  Result Value Ref Range   WBC 10.8 3.4 - 10.8 x10E3/uL   RBC 4.17 3.77 - 5.28 x10E6/uL   Hemoglobin 11.6 11.1 - 15.9 g/dL   Hematocrit 16.1 09.6 - 46.6 %   MCV 86 79 - 97 fL   MCH 27.8 26.6 - 33.0 pg   MCHC 32.4 31.5 - 35.7 g/dL   RDW 04.5 40.9 - 81.1 %   Neutrophils 55 Not Estab. %   Lymphs 34 Not Estab. %   Monocytes 7 Not Estab. %   Eos 3 Not Estab. %   Basos 1 Not Estab. %   Neutrophils Absolute 6.0 1.4 - 7.0 x10E3/uL   Lymphocytes Absolute 3.7 (H) 0.7 - 3.1 x10E3/uL   Monocytes Absolute 0.7 0.1 - 0.9 x10E3/uL   EOS (ABSOLUTE) 0.3 0.0 - 0.4 x10E3/uL   Basophils Absolute 0.1 0.0 - 0.2 x10E3/uL   Immature Granulocytes 0 Not Estab. %   Immature Grans (Abs) 0.0 0.0 - 0.1 x10E3/uL  CMP14+EGFR     Status: None   Collection Time: 04/27/22  3:03 PM  Result Value Ref Range   Glucose 80 70 - 99 mg/dL   BUN 6 6 - 20 mg/dL   Creatinine, Ser 9.14 0.57 - 1.00 mg/dL   eGFR 782 >95 AO/ZHY/8.65   BUN/Creatinine Ratio 9 9 - 23   Sodium 136 134 - 144 mmol/L   Potassium 4.1 3.5 - 5.2 mmol/L   Chloride 101 96 - 106 mmol/L   CO2 20 20 - 29 mmol/L   Calcium 9.8 8.7 - 10.2 mg/dL   Total Protein 7.4 6.0 - 8.5 g/dL   Albumin 4.3 3.9 - 4.9 g/dL   Globulin, Total 3.1 1.5 - 4.5  g/dL   Albumin/Globulin Ratio 1.4 1.2 - 2.2  Bilirubin Total 0.6 0.0 - 1.2 mg/dL   Alkaline Phosphatase 60 44 - 121 IU/L   AST 22 0 - 40 IU/L   ALT 19 0 - 32 IU/L  TSH     Status: None   Collection Time: 04/27/22  3:03 PM  Result Value Ref Range   TSH 2.290 0.450 - 4.500 uIU/mL  Hemoglobin A1c     Status: None   Collection Time: 04/27/22  3:03 PM  Result Value Ref Range   Hgb A1c MFr Bld 5.6 4.8 - 5.6 %    Comment:          Prediabetes: 5.7 - 6.4          Diabetes: >6.4          Glycemic control for adults with diabetes: <7.0    Est. average glucose Bld gHb Est-mCnc 114 mg/dL  Vitamin Z61     Status: None   Collection Time: 04/27/22  3:03 PM  Result Value Ref Range   Vitamin B-12 378 232 - 1,245 pg/mL  Iron, TIBC and Ferritin Panel     Status: Abnormal   Collection Time: 04/27/22  3:03 PM  Result Value Ref Range   Total Iron Binding Capacity 389 250 - 450 ug/dL   UIBC 096 045 - 409 ug/dL   Iron 87 27 - 811 ug/dL   Iron Saturation 22 15 - 55 %   Ferritin 202 (H) 15 - 150 ng/mL  POCT CBG (Fasting - Glucose)     Status: Abnormal   Collection Time: 05/14/22  1:48 PM  Result Value Ref Range   Glucose Fasting, POC 160 (A) 70 - 99 mg/dL  POC CREATINE & ALBUMIN,URINE     Status: Abnormal   Collection Time: 05/14/22  2:52 PM  Result Value Ref Range   Microalbumin Ur, POC 150 mg/L   Creatinine, POC 200 mg/dL   Albumin/Creatinine Ratio, Urine, POC 30-300        Assessment & Plan:   Problem List Items Addressed This Visit       Cardiovascular and Mediastinum   Essential hypertension, benign     Endocrine   Type 2 diabetes mellitus with hyperglycemia (HCC) - Primary (Chronic)    Checking labs today. Will call pt. With results  Continue current diabetes POC, as patient has been well controlled on current regimen.  Will adjust meds if needed based on labs.       Relevant Orders   POCT CBG (Fasting - Glucose) (Completed)   POC CREATINE & ALBUMIN,URINE (Completed)    Polyneuropathy due to type 2 diabetes mellitus (HCC)    Labs look good,  B12 is low, will give her injection today.   If continues having symptoms, will increase Gabapentin dose.  Recheck at follow up.         Hematopoietic and Hemostatic   RESOLVED: Essential (hemorrhagic) thrombocythemia (HCC)    Patient was released in 2018 from Hematology follow up.  She has had normal CBC's as of late.   Resolving problem.          Other   Hyperlipidemia (Chronic)    Continue current therapy for lipid control. Reiterated changes to diet/exercise regimen to help with cholesterol control.  Patient was well controlled without medications previously, have reiterated that she needs to maintain dietary control, since she does not want to start medications.        Anxiety   Vitamin D deficiency, unspecified    Vitamin D still low.  Will continue supplements as needed.   Recheck at 3 month.        B12 deficiency due to diet    Return in about 1 month (around 06/13/2022) for F/U.   Total time spent: 20 minutes  Miki Kins, FNP  05/14/2022

## 2022-05-19 NOTE — Assessment & Plan Note (Signed)
Checking labs today. Will call pt. With results  Continue current diabetes POC, as patient has been well controlled on current regimen.  Will adjust meds if needed based on labs.  

## 2022-05-21 NOTE — Telephone Encounter (Signed)
Discussed at follow up

## 2022-06-14 ENCOUNTER — Encounter: Payer: Self-pay | Admitting: Family

## 2022-06-14 ENCOUNTER — Ambulatory Visit: Payer: Medicaid Other | Admitting: Family

## 2022-06-14 VITALS — BP 158/90 | HR 93 | Ht 69.0 in | Wt 190.0 lb

## 2022-06-14 DIAGNOSIS — E538 Deficiency of other specified B group vitamins: Secondary | ICD-10-CM | POA: Diagnosis not present

## 2022-06-14 DIAGNOSIS — E1165 Type 2 diabetes mellitus with hyperglycemia: Secondary | ICD-10-CM

## 2022-06-14 DIAGNOSIS — E1142 Type 2 diabetes mellitus with diabetic polyneuropathy: Secondary | ICD-10-CM | POA: Diagnosis not present

## 2022-06-14 DIAGNOSIS — F3289 Other specified depressive episodes: Secondary | ICD-10-CM

## 2022-06-14 MED ORDER — GABAPENTIN 300 MG PO CAPS: 300.0000 mg | ORAL_CAPSULE | Freq: Two times a day (BID) | ORAL | 1 refills | Status: DC

## 2022-06-14 MED ORDER — CYANOCOBALAMIN 1000 MCG/ML IJ SOLN
1000.0000 ug | Freq: Once | INTRAMUSCULAR | Status: AC
Start: 2022-06-14 — End: 2022-06-14
  Administered 2022-06-14: 1000 ug via INTRAMUSCULAR

## 2022-06-14 MED ORDER — BUPROPION HCL ER (XL) 150 MG PO TB24: 150.0000 mg | ORAL_TABLET | ORAL | 2 refills | Status: DC

## 2022-06-17 ENCOUNTER — Encounter: Payer: Self-pay | Admitting: Family

## 2022-06-17 NOTE — Assessment & Plan Note (Signed)
Continue current diabetes POC, as patient has been well controlled on current regimen.  Will adjust meds if needed based on labs.  

## 2022-06-17 NOTE — Assessment & Plan Note (Signed)
B12 Injection given in office today.  Will recheck with labs

## 2022-06-17 NOTE — Assessment & Plan Note (Signed)
Increasing Gabapentin dosing.

## 2022-06-17 NOTE — Progress Notes (Signed)
Established Patient Office Visit  Subjective:  Patient ID: Carla Payne, female    DOB: 01/13/1986  Age: 37 y.o. MRN: [id number]  Chief Complaint  Patient presents with   Follow-up    1 month follow up    Patient is here today for her 1 month.  She has still be having significant amounts of fatigue and has continued to have issues with her neuropathy.  She is only on the lower dose of her medication, but she is not doing better.   Finally, she is having a lot of issues with her depression, not wanting to do anything at all.   Her paperwork needs to be completed for work, but they have not sent me the specific paperwork we need, as it has not had the intermittent leave on there.   No other concerns at this time.   Past Medical History:  Diagnosis Date   Anxiety 03/21/2021   Cervical dysplasia 2014   CINI   Diabetes mellitus without complication (HCC) 2014   Dysrhythmia 08/21/2019   rapid pulse   Essential (hemorrhagic) thrombocythemia (HCC) 05/19/2022   Essential hypertension, benign 05/19/2022   Fatty liver 01/11/2016   Hidradenitis 2014   Hypercholesteremia 06/30/2012   Hyperlipidemia 06/30/2012   Pilonidal cyst 2012    Past Surgical History:  Procedure Laterality Date   AXILLARY HIDRADENITIS EXCISION  01-05-13   axillary mass Left 2014 ,may   BREAST CYST ASPIRATION Left 10/08/2017   2 abcesses removed   BREAST CYST ASPIRATION Right 10/2017   abcess removed   COLPOSCOPY  2014   HYDRADENITIS EXCISION Bilateral 10/12/2014   Procedure: EXCISION HIDRADENITIS GROIN;  Surgeon: [name], MD;  Location: [facility];  Service: General;  Laterality: Bilateral;   HYDRADENITIS EXCISION Left 08/24/2016   Procedure: EXCISION HIDRADENITIS AXILLA;  Surgeon: [name], MD;  Location: [facility];  Service: General;  Laterality: Left;   HYDRADENITIS EXCISION Left 09/03/2018   Procedure: EXCISION HIDRADENITIS GROIN, LEFT, DIABETIC;  Surgeon: Duanne Guess, MD;   Location: [facility];  Service: General;  Laterality: Left;   PERINEAL HIDRADENITIS EXCISION     WISDOM TOOTH EXTRACTION      Social History   Socioeconomic History   Marital status: Single    Spouse name: Not on file   Number of children: Not on file   Years of education: Not on file   Highest education level: Not on file  Occupational History   Not on file  Tobacco Use   Smoking status: Some Days    Packs/day: 0.50    Years: 13.00    Additional pack years: 0.00    Total pack years: 6.50    Types: Cigarettes   Smokeless tobacco: Never  Vaping Use   Vaping Use: Never used  Substance and Sexual Activity   Alcohol use: Yes    Comment: wine on weekends   Drug use: No   Sexual activity: Yes    Birth control/protection: Pill  Other Topics Concern   Not on file  Social History Narrative   Not on file   Social Determinants of Health   Financial Resource Strain: Not on file  Food Insecurity: Not on file  Transportation Needs: Not on file  Physical Activity: Not on file  Stress: Not on file  Social Connections: Not on file  Intimate Partner Violence: Not on file    Family History  Problem Relation Age of Onset   Hypertension Father     No Known Allergies  Review of Systems  Constitutional:  Positive for malaise/fatigue.  Musculoskeletal:  Positive for myalgias.  Neurological:  Positive for dizziness, tingling and sensory change.  Psychiatric/Behavioral:  Positive for depression.   All other systems reviewed and are negative.      Objective:   BP (!) 158/90   Pulse 93   Ht 5\' 9"  (1.753 m)   Wt 190 lb (86.2 kg)   SpO2 98%   BMI 28.06 kg/m   Vitals:   06/14/22 1343  BP: (!) 158/90  Pulse: 93  Height: 5\' 9"  (1.753 m)  Weight: 190 lb (86.2 kg)  SpO2: 98%  BMI (Calculated): 28.05    Physical Exam Vitals and nursing note reviewed.  Constitutional:      Appearance: Normal appearance. She is normal weight.  HENT:     Head: Normocephalic.  Eyes:      Extraocular Movements: Extraocular movements intact.     Conjunctiva/sclera: Conjunctivae normal.     Pupils: Pupils are equal, round, and reactive to light.  Cardiovascular:     Rate and Rhythm: Normal rate.  Pulmonary:     Effort: Pulmonary effort is normal.  Neurological:     General: No focal deficit present.     Mental Status: She is alert and oriented to person, place, and time. Mental status is at baseline.     Sensory: Sensation is intact.  Psychiatric:        Attention and Perception: Attention and perception normal.        Mood and Affect: Mood is depressed. Affect is flat.        Speech: Speech normal.        Behavior: Behavior normal.        Thought Content: Thought content normal.        Cognition and Memory: Cognition and memory normal.        Judgment: Judgment normal.      No results found for any visits on 06/14/22.  Recent Results (from the past 2160 hour(s))  POCT CBG (Fasting - Glucose)     Status: Abnormal   Collection Time: 04/27/22  2:31 PM  Result Value Ref Range   Glucose Fasting, POC 122 (A) 70 - 99 mg/dL  Lipid panel     Status: Abnormal   Collection Time: 04/27/22  3:03 PM  Result Value Ref Range   Cholesterol, Total 193 100 - 199 mg/dL   Triglycerides 782 (H) 0 - 149 mg/dL   HDL 66 >95 mg/dL   VLDL Cholesterol Cal 26 5 - 40 mg/dL   LDL Chol Calc (NIH) 621 (H) 0 - 99 mg/dL   Chol/HDL Ratio 2.9 0.0 - 4.4 ratio    Comment:                                   T. Chol/HDL Ratio                                             Men  Women                               1/2 Avg.Risk  3.4    3.3  Avg.Risk  5.0    4.4                                2X Avg.Risk  9.6    7.1                                3X Avg.Risk 23.4   11.0   VITAMIN D 25 Hydroxy (Vit-D Deficiency, Fractures)     Status: None   Collection Time: 04/27/22  3:03 PM  Result Value Ref Range   Vit D, 25-Hydroxy 39.5 30.0 - 100.0 ng/mL    Comment: Vitamin D  deficiency has been defined by the Institute of Medicine and an Endocrine Society practice guideline as a level of serum 25-OH vitamin D less than 20 ng/mL (1,2). The Endocrine Society went on to further define vitamin D insufficiency as a level between 21 and 29 ng/mL (2). 1. IOM (Institute of Medicine). 2010. Dietary reference    intakes for calcium and D. [place]: The    [employer]. 2. Holick MF, Binkley Anna, Bischoff-Ferrari HA, et al.    Evaluation, treatment, and prevention of vitamin D    deficiency: an Endocrine Society clinical practice    guideline. JCEM. 2011 Jul; 96(7):1911-30.   CBC With Differential     Status: Abnormal   Collection Time: 04/27/22  3:03 PM  Result Value Ref Range   WBC 10.8 3.4 - 10.8 x10E3/uL   RBC 4.17 3.77 - 5.28 x10E6/uL   Hemoglobin 11.6 11.1 - 15.9 g/dL   Hematocrit 40.9 81.1 - 46.6 %   MCV 86 79 - 97 fL   MCH 27.8 26.6 - 33.0 pg   MCHC 32.4 31.5 - 35.7 g/dL   RDW 91.4 78.2 - 95.6 %   Neutrophils 55 Not Estab. %   Lymphs 34 Not Estab. %   Monocytes 7 Not Estab. %   Eos 3 Not Estab. %   Basos 1 Not Estab. %   Neutrophils Absolute 6.0 1.4 - 7.0 x10E3/uL   Lymphocytes Absolute 3.7 (H) 0.7 - 3.1 x10E3/uL   Monocytes Absolute 0.7 0.1 - 0.9 x10E3/uL   EOS (ABSOLUTE) 0.3 0.0 - 0.4 x10E3/uL   Basophils Absolute 0.1 0.0 - 0.2 x10E3/uL   Immature Granulocytes 0 Not Estab. %   Immature Grans (Abs) 0.0 0.0 - 0.1 x10E3/uL  CMP14+EGFR     Status: None   Collection Time: 04/27/22  3:03 PM  Result Value Ref Range   Glucose 80 70 - 99 mg/dL   BUN 6 6 - 20 mg/dL   Creatinine, Ser 2.13 0.57 - 1.00 mg/dL   eGFR 086 >57 QI/ONG/2.95   BUN/Creatinine Ratio 9 9 - 23   Sodium 136 134 - 144 mmol/L   Potassium 4.1 3.5 - 5.2 mmol/L   Chloride 101 96 - 106 mmol/L   CO2 20 20 - 29 mmol/L   Calcium 9.8 8.7 - 10.2 mg/dL   Total Protein 7.4 6.0 - 8.5 g/dL   Albumin 4.3 3.9 - 4.9 g/dL   Globulin, Total 3.1 1.5 - 4.5 g/dL   Albumin/Globulin  Ratio 1.4 1.2 - 2.2   Bilirubin Total 0.6 0.0 - 1.2 mg/dL   Alkaline Phosphatase 60 44 - 121 IU/L   AST 22 0 - 40 IU/L   ALT 19 0 - 32 IU/L  TSH     Status:  None   Collection Time: 04/27/22  3:03 PM  Result Value Ref Range   TSH 2.290 0.450 - 4.500 uIU/mL  Hemoglobin A1c     Status: None   Collection Time: 04/27/22  3:03 PM  Result Value Ref Range   Hgb A1c MFr Bld 5.6 4.8 - 5.6 %    Comment:          Prediabetes: 5.7 - 6.4          Diabetes: >6.4          Glycemic control for adults with diabetes: <7.0    Est. average glucose Bld gHb Est-mCnc 114 mg/dL  Vitamin Z61     Status: None   Collection Time: 04/27/22  3:03 PM  Result Value Ref Range   Vitamin B-12 378 232 - 1,245 pg/mL  Iron, TIBC and Ferritin Panel     Status: Abnormal   Collection Time: 04/27/22  3:03 PM  Result Value Ref Range   Total Iron Binding Capacity 389 250 - 450 ug/dL   UIBC 096 045 - 409 ug/dL   Iron 87 27 - 811 ug/dL   Iron Saturation 22 15 - 55 %   Ferritin 202 (H) 15 - 150 ng/mL  POCT CBG (Fasting - Glucose)     Status: Abnormal   Collection Time: 05/14/22  1:48 PM  Result Value Ref Range   Glucose Fasting, POC 160 (A) 70 - 99 mg/dL  POC CREATINE & ALBUMIN,URINE     Status: Abnormal   Collection Time: 05/14/22  2:52 PM  Result Value Ref Range   Microalbumin Ur, POC 150 mg/L   Creatinine, POC 200 mg/dL   Albumin/Creatinine Ratio, Urine, POC 30-300        Assessment & Plan:   Problem List Items Addressed This Visit       Active Problems   Type 2 diabetes mellitus with hyperglycemia (HCC) - Primary (Chronic)    Continue current diabetes POC, as patient has been well controlled on current regimen.  Will adjust meds if needed based on labs.        Relevant Orders   Ambulatory referral to Ophthalmology   Polyneuropathy due to type 2 diabetes mellitus (HCC)    Increasing Gabapentin dosing.        Relevant Medications   buPROPion (WELLBUTRIN XL) 150 MG 24 hr tablet   gabapentin  (NEURONTIN) 300 MG capsule   B12 deficiency due to diet    B12 Injection given in office today.  Will recheck with labs       Other Visit Diagnoses     Other depression       Starting wellbutrin for pt.  Will recheck in 1 month.   Relevant Medications   buPROPion (WELLBUTRIN XL) 150 MG 24 hr tablet       Return in about 1 month (around 07/15/2022).   Total time spent: 20 minutes  Miki Kins, FNP  06/14/2022   This document may have been prepared by [facility] Voice Recognition software and as such may include unintentional dictation errors.

## 2022-06-21 ENCOUNTER — Encounter: Payer: Self-pay | Admitting: Family

## 2022-07-12 ENCOUNTER — Other Ambulatory Visit: Payer: Self-pay | Admitting: Family

## 2022-07-16 ENCOUNTER — Encounter: Payer: Self-pay | Admitting: Family

## 2022-07-16 ENCOUNTER — Ambulatory Visit (INDEPENDENT_AMBULATORY_CARE_PROVIDER_SITE_OTHER): Payer: Medicaid Other | Admitting: Family

## 2022-07-16 VITALS — BP 150/99 | HR 109 | Ht 69.0 in | Wt 193.6 lb

## 2022-07-16 DIAGNOSIS — R058 Contact with and (suspected) exposure to covid-19: Secondary | ICD-10-CM

## 2022-07-16 DIAGNOSIS — J029 Acute pharyngitis, unspecified: Secondary | ICD-10-CM | POA: Diagnosis not present

## 2022-07-16 DIAGNOSIS — E538 Deficiency of other specified B group vitamins: Secondary | ICD-10-CM | POA: Diagnosis not present

## 2022-07-16 DIAGNOSIS — R059 Cough, unspecified: Secondary | ICD-10-CM | POA: Diagnosis not present

## 2022-07-16 DIAGNOSIS — E1142 Type 2 diabetes mellitus with diabetic polyneuropathy: Secondary | ICD-10-CM

## 2022-07-16 DIAGNOSIS — E1165 Type 2 diabetes mellitus with hyperglycemia: Secondary | ICD-10-CM

## 2022-07-16 LAB — POCT RAPID STREP A (OFFICE): Rapid Strep A Screen: NEGATIVE

## 2022-07-16 LAB — POCT XPERT XPRESS SARS COVID-2/FLU/RSV
FLU A: NEGATIVE
FLU B: NEGATIVE
RSV RNA, PCR: NEGATIVE
SARS Coronavirus 2: NEGATIVE

## 2022-07-16 MED ORDER — CYANOCOBALAMIN 1000 MCG/ML IJ SOLN
1000.0000 ug | Freq: Once | INTRAMUSCULAR | Status: AC
Start: 2022-07-16 — End: 2022-07-16
  Administered 2022-07-16: 1000 ug via INTRAMUSCULAR

## 2022-07-16 NOTE — Assessment & Plan Note (Signed)
Patient stable.  Well controlled with current therapy.   Continue current meds.  

## 2022-07-16 NOTE — Assessment & Plan Note (Signed)
Checking labs today. Will call pt. With results  Continue current diabetes POC, as patient has been well controlled on current regimen.  Will adjust meds if needed based on labs.  

## 2022-07-16 NOTE — Assessment & Plan Note (Signed)
B12 injection given in office today.  Will recheck at follow up appointment

## 2022-07-16 NOTE — Progress Notes (Signed)
Established Patient Office Visit  Subjective:  Patient ID: Carla Payne, female    DOB: [birth year]  Age: 37 y.o. MRN: [id number]  Chief Complaint  Patient presents with   Follow-up    1 mo F/U    Sore Throat  This is a new problem. The current episode started in the past 7 days. The problem has been rapidly worsening. Neither side of throat is experiencing more pain than the other. There has been no fever. She has had exposure to strep. She has tried nothing for the symptoms. The treatment provided no relief.    No other concerns at this time.   Past Medical History:  Diagnosis Date   Anxiety 03/21/2021   Cervical dysplasia 2014   CINI   Diabetes mellitus without complication (HCC) 2014   Dysrhythmia 08/21/2019   rapid pulse   Essential (hemorrhagic) thrombocythemia (HCC) 05/19/2022   Essential hypertension, benign 05/19/2022   Fatty liver 01/11/2016   Hidradenitis 2014   Hypercholesteremia 06/30/2012   Hyperlipidemia 06/30/2012   Pilonidal cyst 2012    Past Surgical History:  Procedure Laterality Date   AXILLARY HIDRADENITIS EXCISION  01-05-13   axillary mass Left 2014 ,may   BREAST CYST ASPIRATION Left 10/08/2017   2 abcesses removed   BREAST CYST ASPIRATION Right 10/2017   abcess removed   COLPOSCOPY  2014   HYDRADENITIS EXCISION Bilateral 10/12/2014   Procedure: EXCISION HIDRADENITIS GROIN;  Surgeon: [name], MD;  Location: [facility];  Service: General;  Laterality: Bilateral;   HYDRADENITIS EXCISION Left 08/24/2016   Procedure: EXCISION HIDRADENITIS AXILLA;  Surgeon: [name], MD;  Location: [facility];  Service: General;  Laterality: Left;   HYDRADENITIS EXCISION Left 09/03/2018   Procedure: EXCISION HIDRADENITIS GROIN, LEFT, DIABETIC;  Surgeon: Duanne Guess, MD;  Location: [facility];  Service: General;  Laterality: Left;   PERINEAL HIDRADENITIS EXCISION     WISDOM TOOTH EXTRACTION      Social History   Socioeconomic History    Marital status: Single    Spouse name: Not on file   Number of children: Not on file   Years of education: Not on file   Highest education level: Not on file  Occupational History   Not on file  Tobacco Use   Smoking status: Some Days    Packs/day: 0.50    Years: 13.00    Additional pack years: 0.00    Total pack years: 6.50    Types: Cigarettes   Smokeless tobacco: Never  Vaping Use   Vaping Use: Never used  Substance and Sexual Activity   Alcohol use: Yes    Comment: wine on weekends   Drug use: No   Sexual activity: Yes    Birth control/protection: Pill  Other Topics Concern   Not on file  Social History Narrative   Not on file   Social Determinants of Health   Financial Resource Strain: Not on file  Food Insecurity: Not on file  Transportation Needs: Not on file  Physical Activity: Not on file  Stress: Not on file  Social Connections: Not on file  Intimate Partner Violence: Not on file    Family History  Problem Relation Age of Onset   Hypertension Father     No Known Allergies  Review of Systems  Constitutional:  Positive for malaise/fatigue.  HENT:  Positive for sore throat.   All other systems reviewed and are negative.      Objective:   BP (!) 150/99  Pulse (!) 109   Ht 5\' 9"  (1.753 m)   Wt 193 lb 9.6 oz (87.8 kg)   SpO2 99%   BMI 28.59 kg/m   Vitals:   07/16/22 1345  BP: (!) 150/99  Pulse: (!) 109  Height: 5\' 9"  (1.753 m)  Weight: 193 lb 9.6 oz (87.8 kg)  SpO2: 99%  BMI (Calculated): 28.58    Physical Exam   Results for orders placed or performed in visit on 07/16/22  POCT rapid strep A  Result Value Ref Range   Rapid Strep A Screen Negative Negative  POCT XPERT XPRESS SARS COVID-2/FLU/RSV  Result Value Ref Range   SARS Coronavirus 2 neg    FLU A neg    FLU B neg    RSV RNA, PCR neg     Recent Results (from the past 2160 hour(s))  POCT CBG (Fasting - Glucose)     Status: Abnormal   Collection Time: 04/27/22  2:31 PM   Result Value Ref Range   Glucose Fasting, POC 122 (A) 70 - 99 mg/dL  Lipid panel     Status: Abnormal   Collection Time: 04/27/22  3:03 PM  Result Value Ref Range   Cholesterol, Total 193 100 - 199 mg/dL   Triglycerides 161 (H) 0 - 149 mg/dL   HDL 66 >09 mg/dL   VLDL Cholesterol Cal 26 5 - 40 mg/dL   LDL Chol Calc (NIH) 604 (H) 0 - 99 mg/dL   Chol/HDL Ratio 2.9 0.0 - 4.4 ratio    Comment:                                   T. Chol/HDL Ratio                                             Men  Women                               1/2 Avg.Risk  3.4    3.3                                   Avg.Risk  5.0    4.4                                2X Avg.Risk  9.6    7.1                                3X Avg.Risk 23.4   11.0   VITAMIN D 25 Hydroxy (Vit-D Deficiency, Fractures)     Status: None   Collection Time: 04/27/22  3:03 PM  Result Value Ref Range   Vit D, 25-Hydroxy 39.5 30.0 - 100.0 ng/mL    Comment: Vitamin D deficiency has been defined by the Institute of Medicine and an Endocrine Society practice guideline as a level of serum 25-OH vitamin D less than 20 ng/mL (1,2). The Endocrine Society went on to further define vitamin D insufficiency as a level between 21 and 29 ng/mL (2). 1. IOM (Institute of Medicine).  2010. Dietary reference    intakes for calcium and D. [place]: The    [employer]. 2. Holick MF, Binkley [place], Bischoff-Ferrari HA, et al.    Evaluation, treatment, and prevention of vitamin D    deficiency: an Endocrine Society clinical practice    guideline. JCEM. 2011 Jul; 96(7):1911-30.   CBC With Differential     Status: Abnormal   Collection Time: 04/27/22  3:03 PM  Result Value Ref Range   WBC 10.8 3.4 - 10.8 x10E3/uL   RBC 4.17 3.77 - 5.28 x10E6/uL   Hemoglobin 11.6 11.1 - 15.9 g/dL   Hematocrit 09.8 11.9 - 46.6 %   MCV 86 79 - 97 fL   MCH 27.8 26.6 - 33.0 pg   MCHC 32.4 31.5 - 35.7 g/dL   RDW 14.7 82.9 - 56.2 %   Neutrophils 55 Not Estab. %    Lymphs 34 Not Estab. %   Monocytes 7 Not Estab. %   Eos 3 Not Estab. %   Basos 1 Not Estab. %   Neutrophils Absolute 6.0 1.4 - 7.0 x10E3/uL   Lymphocytes Absolute 3.7 (H) 0.7 - 3.1 x10E3/uL   Monocytes Absolute 0.7 0.1 - 0.9 x10E3/uL   EOS (ABSOLUTE) 0.3 0.0 - 0.4 x10E3/uL   Basophils Absolute 0.1 0.0 - 0.2 x10E3/uL   Immature Granulocytes 0 Not Estab. %   Immature Grans (Abs) 0.0 0.0 - 0.1 x10E3/uL  CMP14+EGFR     Status: None   Collection Time: 04/27/22  3:03 PM  Result Value Ref Range   Glucose 80 70 - 99 mg/dL   BUN 6 6 - 20 mg/dL   Creatinine, Ser 1.30 0.57 - 1.00 mg/dL   eGFR 865 >78 IO/NGE/9.52   BUN/Creatinine Ratio 9 9 - 23   Sodium 136 134 - 144 mmol/L   Potassium 4.1 3.5 - 5.2 mmol/L   Chloride 101 96 - 106 mmol/L   CO2 20 20 - 29 mmol/L   Calcium 9.8 8.7 - 10.2 mg/dL   Total Protein 7.4 6.0 - 8.5 g/dL   Albumin 4.3 3.9 - 4.9 g/dL   Globulin, Total 3.1 1.5 - 4.5 g/dL   Albumin/Globulin Ratio 1.4 1.2 - 2.2   Bilirubin Total 0.6 0.0 - 1.2 mg/dL   Alkaline Phosphatase 60 44 - 121 IU/L   AST 22 0 - 40 IU/L   ALT 19 0 - 32 IU/L  TSH     Status: None   Collection Time: 04/27/22  3:03 PM  Result Value Ref Range   TSH 2.290 0.450 - 4.500 uIU/mL  Hemoglobin A1c     Status: None   Collection Time: 04/27/22  3:03 PM  Result Value Ref Range   Hgb A1c MFr Bld 5.6 4.8 - 5.6 %    Comment:          Prediabetes: 5.7 - 6.4          Diabetes: >6.4          Glycemic control for adults with diabetes: <7.0    Est. average glucose Bld gHb Est-mCnc 114 mg/dL  Vitamin W41     Status: None   Collection Time: 04/27/22  3:03 PM  Result Value Ref Range   Vitamin B-12 378 232 - 1,245 pg/mL  Iron, TIBC and Ferritin Panel     Status: Abnormal   Collection Time: 04/27/22  3:03 PM  Result Value Ref Range   Total Iron Binding Capacity 389 250 - 450 ug/dL   UIBC 324  131 - 425 ug/dL   Iron 87 27 - 161 ug/dL   Iron Saturation 22 15 - 55 %   Ferritin 202 (H) 15 - 150 ng/mL  POCT CBG  (Fasting - Glucose)     Status: Abnormal   Collection Time: 05/14/22  1:48 PM  Result Value Ref Range   Glucose Fasting, POC 160 (A) 70 - 99 mg/dL  POC CREATINE & ALBUMIN,URINE     Status: Abnormal   Collection Time: 05/14/22  2:52 PM  Result Value Ref Range   Microalbumin Ur, POC 150 mg/L   Creatinine, POC 200 mg/dL   Albumin/Creatinine Ratio, Urine, POC 30-300   POCT XPERT XPRESS SARS COVID-2/FLU/RSV     Status: None   Collection Time: 07/16/22  3:23 PM  Result Value Ref Range   SARS Coronavirus 2 neg    FLU A neg    FLU B neg    RSV RNA, PCR neg   POCT rapid strep A     Status: None   Collection Time: 07/16/22  3:24 PM  Result Value Ref Range   Rapid Strep A Screen Negative Negative       Assessment & Plan:   Problem List Items Addressed This Visit       Active Problems   Type 2 diabetes mellitus with hyperglycemia (HCC) (Chronic)    Checking labs today. Will call pt. With results  Continue current diabetes POC, as patient has been well controlled on current regimen.  Will adjust meds if needed based on labs.         Polyneuropathy due to type 2 diabetes mellitus (HCC)    Patient stable.  Well controlled with current therapy.   Continue current meds.       B12 deficiency due to diet - Primary    B12 injection given in office today.  Will recheck at follow up appointment      Other Visit Diagnoses     Pharyngitis, unspecified etiology       Strep test in office today - result negative.  She will let me know if this does not improve.   Relevant Orders   POCT rapid strep A (Completed)   Cough, unspecified type       Relevant Orders   POCT XPERT XPRESS SARS COVID-2/FLU/RSV (Completed)   Cough with exposure to COVID-19 virus       COVID/Flu/RSV today in office. Will send RX for cough.   Relevant Orders   POCT XPERT XPRESS SARS COVID-2/FLU/RSV (Completed)       Return in about 1 month (around 08/16/2022) for F/U.   Total time spent: 20  minutes  Miki Kins, FNP  07/16/2022   This document may have been prepared by [facility] Voice Recognition software and as such may include unintentional dictation errors.

## 2022-07-26 ENCOUNTER — Encounter: Payer: Self-pay | Admitting: Family

## 2022-07-29 ENCOUNTER — Other Ambulatory Visit: Payer: Self-pay | Admitting: Family

## 2022-08-10 ENCOUNTER — Other Ambulatory Visit: Payer: Self-pay | Admitting: Family

## 2022-08-17 ENCOUNTER — Ambulatory Visit: Payer: Medicaid Other | Admitting: Family

## 2022-09-22 ENCOUNTER — Other Ambulatory Visit: Payer: Self-pay | Admitting: Family

## 2022-11-05 ENCOUNTER — Encounter: Payer: Self-pay | Admitting: Family

## 2022-11-12 ENCOUNTER — Other Ambulatory Visit: Payer: Self-pay | Admitting: Family

## 2022-12-18 ENCOUNTER — Other Ambulatory Visit: Payer: Self-pay | Admitting: Family

## 2022-12-21 ENCOUNTER — Other Ambulatory Visit: Payer: Self-pay | Admitting: Family

## 2022-12-28 ENCOUNTER — Other Ambulatory Visit: Payer: Self-pay | Admitting: Family

## 2023-02-20 ENCOUNTER — Ambulatory Visit (INDEPENDENT_AMBULATORY_CARE_PROVIDER_SITE_OTHER): Payer: Medicaid Other | Admitting: Family

## 2023-02-20 ENCOUNTER — Encounter: Payer: Self-pay | Admitting: Family

## 2023-02-20 VITALS — BP 152/91 | HR 94 | Ht 69.0 in | Wt 207.6 lb

## 2023-02-20 DIAGNOSIS — I10 Essential (primary) hypertension: Secondary | ICD-10-CM

## 2023-02-20 DIAGNOSIS — E1165 Type 2 diabetes mellitus with hyperglycemia: Secondary | ICD-10-CM | POA: Diagnosis not present

## 2023-02-20 DIAGNOSIS — E782 Mixed hyperlipidemia: Secondary | ICD-10-CM

## 2023-02-20 DIAGNOSIS — E559 Vitamin D deficiency, unspecified: Secondary | ICD-10-CM | POA: Diagnosis not present

## 2023-02-20 DIAGNOSIS — F419 Anxiety disorder, unspecified: Secondary | ICD-10-CM

## 2023-02-20 DIAGNOSIS — E538 Deficiency of other specified B group vitamins: Secondary | ICD-10-CM

## 2023-02-20 DIAGNOSIS — R5383 Other fatigue: Secondary | ICD-10-CM

## 2023-02-20 DIAGNOSIS — Z1159 Encounter for screening for other viral diseases: Secondary | ICD-10-CM

## 2023-02-20 DIAGNOSIS — Z114 Encounter for screening for human immunodeficiency virus [HIV]: Secondary | ICD-10-CM

## 2023-02-20 DIAGNOSIS — E1142 Type 2 diabetes mellitus with diabetic polyneuropathy: Secondary | ICD-10-CM

## 2023-02-20 MED ORDER — BUSPIRONE HCL 5 MG PO TABS: 5.0000 mg | ORAL_TABLET | Freq: Two times a day (BID) | ORAL | 1 refills | Status: DC

## 2023-02-20 MED ORDER — ESCITALOPRAM OXALATE 20 MG PO TABS: 20.0000 mg | ORAL_TABLET | Freq: Every day | ORAL | 1 refills | Status: DC

## 2023-02-20 MED ORDER — OZEMPIC (1 MG/DOSE) 4 MG/3ML ~~LOC~~ SOPN: 1.0000 mg | PEN_INJECTOR | SUBCUTANEOUS | 3 refills | Status: AC

## 2023-02-20 MED ORDER — VITAMIN D (ERGOCALCIFEROL) 1.25 MG (50000 UNIT) PO CAPS: 50000.0000 [IU] | ORAL_CAPSULE | ORAL | 1 refills | Status: AC

## 2023-02-20 MED ORDER — LOSARTAN POTASSIUM 25 MG PO TABS: 25.0000 mg | ORAL_TABLET | Freq: Every day | ORAL | 1 refills | Status: AC

## 2023-02-21 LAB — CBC WITH DIFFERENTIAL/PLATELET
Basophils Absolute: 0.1 10*3/uL (ref 0.0–0.2)
Basos: 1 %
EOS (ABSOLUTE): 0.3 10*3/uL (ref 0.0–0.4)
Eos: 2 %
Hematocrit: 37.5 % (ref 34.0–46.6)
Hemoglobin: 11.9 g/dL (ref 11.1–15.9)
Immature Grans (Abs): 0 10*3/uL (ref 0.0–0.1)
Immature Granulocytes: 0 %
Lymphocytes Absolute: 3 10*3/uL (ref 0.7–3.1)
Lymphs: 29 %
MCH: 27.1 pg (ref 26.6–33.0)
MCHC: 31.7 g/dL (ref 31.5–35.7)
MCV: 85 fL (ref 79–97)
Monocytes Absolute: 0.8 10*3/uL (ref 0.1–0.9)
Monocytes: 8 %
Neutrophils Absolute: 6.4 10*3/uL (ref 1.4–7.0)
Neutrophils: 60 %
Platelets: 397 10*3/uL (ref 150–450)
RBC: 4.39 x10E6/uL (ref 3.77–5.28)
RDW: 11.9 % (ref 11.7–15.4)
WBC: 10.6 10*3/uL (ref 3.4–10.8)

## 2023-02-21 LAB — CMP14+EGFR
ALT: 15 [IU]/L (ref 0–32)
AST: 17 [IU]/L (ref 0–40)
Albumin: 4.2 g/dL (ref 3.9–4.9)
Alkaline Phosphatase: 55 [IU]/L (ref 44–121)
BUN/Creatinine Ratio: 11 (ref 9–23)
BUN: 7 mg/dL (ref 6–20)
Bilirubin Total: 0.2 mg/dL (ref 0.0–1.2)
CO2: 17 mmol/L — ABNORMAL LOW (ref 20–29)
Calcium: 9.6 mg/dL (ref 8.7–10.2)
Chloride: 101 mmol/L (ref 96–106)
Creatinine, Ser: 0.61 mg/dL (ref 0.57–1.00)
Globulin, Total: 2.9 g/dL (ref 1.5–4.5)
Glucose: 110 mg/dL — ABNORMAL HIGH (ref 70–99)
Potassium: 4.7 mmol/L (ref 3.5–5.2)
Sodium: 138 mmol/L (ref 134–144)
Total Protein: 7.1 g/dL (ref 6.0–8.5)
eGFR: 117 mL/min/{1.73_m2} (ref >59)

## 2023-02-21 LAB — HCV AB W REFLEX TO QUANT PCR: HCV Ab: NONREACTIVE

## 2023-02-21 LAB — LIPID PANEL
Chol/HDL Ratio: 2.7 {ratio} (ref 0.0–4.4)
Cholesterol, Total: 184 mg/dL (ref 100–199)
HDL: 68 mg/dL (ref >39)
LDL Chol Calc (NIH): 98 mg/dL (ref 0–99)
Triglycerides: 98 mg/dL (ref 0–149)
VLDL Cholesterol Cal: 18 mg/dL (ref 5–40)

## 2023-02-21 LAB — HCV INTERPRETATION

## 2023-02-21 LAB — VITAMIN D 25 HYDROXY (VIT D DEFICIENCY, FRACTURES): Vit D, 25-Hydroxy: 22 ng/mL — ABNORMAL LOW (ref 30.0–100.0)

## 2023-02-21 LAB — HIV ANTIBODY (ROUTINE TESTING W REFLEX): HIV Screen 4th Generation wRfx: NONREACTIVE

## 2023-02-21 LAB — TSH: TSH: 2.8 u[IU]/mL (ref 0.450–4.500)

## 2023-02-21 LAB — HEMOGLOBIN A1C
Est. average glucose Bld gHb Est-mCnc: 131 mg/dL
Hgb A1c MFr Bld: 6.2 % — ABNORMAL HIGH (ref 4.8–5.6)

## 2023-02-21 LAB — VITAMIN B12: Vitamin B-12: 252 pg/mL (ref 232–1245)

## 2023-02-24 ENCOUNTER — Encounter: Payer: Self-pay | Admitting: Family

## 2023-02-24 NOTE — Progress Notes (Signed)
 Established Patient Office Visit  Subjective:  Patient ID: LESSLY Payne, female    DOB: November 26, 1985  Age: 38 y.o. MRN: [id number]  No chief complaint on file.   Patient is here today for her 4 months follow up.  She has been feeling poorly since last appointment.   She does have additional concerns to discuss today.  She had previously been on the Lexapro , but says she stopped it while she was trying to manage without it, but she needs refills.  Labs are due today. She needs refills.   I have reviewed her active problem list, medication list, allergies, health maintenance, notes from last encounter, lab results for her appointment today.      No other concerns at this time.   Past Medical History:  Diagnosis Date   Anxiety 03/21/2021   Cervical dysplasia 2014   CINI   Diabetes mellitus without complication (HCC) 2014   Dysrhythmia 08/21/2019   rapid pulse   Essential (hemorrhagic) thrombocythemia (HCC) 05/19/2022   Essential hypertension, benign 05/19/2022   Fatty liver 01/11/2016   Hidradenitis 2014   Hypercholesteremia 06/30/2012   Hyperlipidemia 06/30/2012   Pilonidal cyst 2012    Past Surgical History:  Procedure Laterality Date   AXILLARY HIDRADENITIS EXCISION  01-05-13   axillary mass Left 2014 ,may   BREAST CYST ASPIRATION Left 10/08/2017   2 abcesses removed   BREAST CYST ASPIRATION Right 10/2017   abcess removed   COLPOSCOPY  2014   HYDRADENITIS EXCISION Bilateral 10/12/2014   Procedure: EXCISION HIDRADENITIS GROIN;  Surgeon: Reyes LELON Cota, MD;  Location: [facility];  Service: General;  Laterality: Bilateral;   HYDRADENITIS EXCISION Left 08/24/2016   Procedure: EXCISION HIDRADENITIS AXILLA;  Surgeon: Cota Reyes LELON, MD;  Location: [facility];  Service: General;  Laterality: Left;   HYDRADENITIS EXCISION Left 09/03/2018   Procedure: EXCISION HIDRADENITIS GROIN, LEFT, DIABETIC;  Surgeon: Marolyn Nest, MD;  Location: [facility];  Service: General;   Laterality: Left;   PERINEAL HIDRADENITIS EXCISION     WISDOM TOOTH EXTRACTION      Social History   Socioeconomic History   Marital status: Single    Spouse name: Not on file   Number of children: Not on file   Years of education: Not on file   Highest education level: Not on file  Occupational History   Not on file  Tobacco Use   Smoking status: Some Days    Current packs/day: 0.50    Average packs/day: 0.5 packs/day for 13.0 years (6.5 ttl pk-yrs)    Types: Cigarettes   Smokeless tobacco: Never  Vaping Use   Vaping status: Never Used  Substance and Sexual Activity   Alcohol use: Yes    Comment: wine on weekends   Drug use: No   Sexual activity: Yes    Birth control/protection: Pill  Other Topics Concern   Not on file  Social History Narrative   Not on file   Social Drivers of Health   Financial Resource Strain: Not on file  Food Insecurity: Not on file  Transportation Needs: Not on file  Physical Activity: Not on file  Stress: Not on file  Social Connections: Not on file  Intimate Partner Violence: Not on file    Family History  Problem Relation Age of Onset   Hypertension Father     Allergies  Allergen Reactions   Bupropion  Other (See Comments)    Vivid Dreams, Seizures, headaches    Review of Systems  All other  systems reviewed and are negative.      Objective:   BP (!) 152/91   Pulse 94   Ht 5' 9 (1.753 m)   Wt 207 lb 9.6 oz (94.2 kg)   SpO2 99%   BMI 30.66 kg/m   Vitals:   02/20/23 1348  BP: (!) 152/91  Pulse: 94  Height: 5' 9 (1.753 m)  Weight: 207 lb 9.6 oz (94.2 kg)  SpO2: 99%  BMI (Calculated): 30.64    Physical Exam Vitals and nursing note reviewed.  Constitutional:      Appearance: Normal appearance. She is normal weight.  HENT:     Head: Normocephalic.  Eyes:     Extraocular Movements: Extraocular movements intact.     Conjunctiva/sclera: Conjunctivae normal.     Pupils: Pupils are equal, round, and reactive to  light.  Cardiovascular:     Rate and Rhythm: Normal rate.  Pulmonary:     Effort: Pulmonary effort is normal.  Neurological:     General: No focal deficit present.     Mental Status: She is alert and oriented to person, place, and time. Mental status is at baseline.  Psychiatric:        Mood and Affect: Mood normal.        Behavior: Behavior normal.        Thought Content: Thought content normal.      Results for orders placed or performed in visit on 02/20/23  Lipid panel  Result Value Ref Range   Cholesterol, Total 184 100 - 199 mg/dL   Triglycerides 98 0 - 149 mg/dL   HDL 68 >60 mg/dL   VLDL Cholesterol Cal 18 5 - 40 mg/dL   LDL Chol Calc (NIH) 98 0 - 99 mg/dL   Chol/HDL Ratio 2.7 0.0 - 4.4 ratio  VITAMIN D  25 Hydroxy (Vit-D Deficiency, Fractures)  Result Value Ref Range   Vit D, 25-Hydroxy 22.0 (L) 30.0 - 100.0 ng/mL  CMP14+EGFR  Result Value Ref Range   Glucose 110 (H) 70 - 99 mg/dL   BUN 7 6 - 20 mg/dL   Creatinine, Ser 9.38 0.57 - 1.00 mg/dL   eGFR 882 >40 fO/fpw/8.26   BUN/Creatinine Ratio 11 9 - 23   Sodium 138 134 - 144 mmol/L   Potassium 4.7 3.5 - 5.2 mmol/L   Chloride 101 96 - 106 mmol/L   CO2 17 (L) 20 - 29 mmol/L   Calcium  9.6 8.7 - 10.2 mg/dL   Total Protein 7.1 6.0 - 8.5 g/dL   Albumin 4.2 3.9 - 4.9 g/dL   Globulin, Total 2.9 1.5 - 4.5 g/dL   Bilirubin Total 0.2 0.0 - 1.2 mg/dL   Alkaline Phosphatase 55 44 - 121 IU/L   AST 17 0 - 40 IU/L   ALT 15 0 - 32 IU/L  TSH  Result Value Ref Range   TSH 2.800 0.450 - 4.500 uIU/mL  Hemoglobin A1c  Result Value Ref Range   Hgb A1c MFr Bld 6.2 (H) 4.8 - 5.6 %   Est. average glucose Bld gHb Est-mCnc 131 mg/dL  Vitamin B12  Result Value Ref Range   Vitamin B-12 252 232 - 1,245 pg/mL  CBC with Diff  Result Value Ref Range   WBC 10.6 3.4 - 10.8 x10E3/uL   RBC 4.39 3.77 - 5.28 x10E6/uL   Hemoglobin 11.9 11.1 - 15.9 g/dL   Hematocrit 62.4 65.9 - 46.6 %   MCV 85 79 - 97 fL   MCH 27.1 26.6 - 33.0  pg   MCHC  31.7 31.5 - 35.7 g/dL   RDW 88.0 88.2 - 84.5 %   Platelets 397 150 - 450 x10E3/uL   Neutrophils 60 Not Estab. %   Lymphs 29 Not Estab. %   Monocytes 8 Not Estab. %   Eos 2 Not Estab. %   Basos 1 Not Estab. %   Neutrophils Absolute 6.4 1.4 - 7.0 x10E3/uL   Lymphocytes Absolute 3.0 0.7 - 3.1 x10E3/uL   Monocytes Absolute 0.8 0.1 - 0.9 x10E3/uL   EOS (ABSOLUTE) 0.3 0.0 - 0.4 x10E3/uL   Basophils Absolute 0.1 0.0 - 0.2 x10E3/uL   Immature Granulocytes 0 Not Estab. %   Immature Grans (Abs) 0.0 0.0 - 0.1 x10E3/uL  HIV antibody (with reflex)  Result Value Ref Range   HIV Screen 4th Generation wRfx Non Reactive Non Reactive  Hepatitis C Ab reflex to Quant PCR  Result Value Ref Range   HCV Ab Non Reactive Non Reactive  Interpretation:  Result Value Ref Range   HCV Interp 1: Comment     Recent Results (from the past 2160 hours)  Lipid panel     Status: None   Collection Time: 02/20/23  2:21 PM  Result Value Ref Range   Cholesterol, Total 184 100 - 199 mg/dL   Triglycerides 98 0 - 149 mg/dL   HDL 68 >60 mg/dL   VLDL Cholesterol Cal 18 5 - 40 mg/dL   LDL Chol Calc (NIH) 98 0 - 99 mg/dL   Chol/HDL Ratio 2.7 0.0 - 4.4 ratio    Comment:                                   T. Chol/HDL Ratio                                             Men  Women                               1/2 Avg.Risk  3.4    3.3                                   Avg.Risk  5.0    4.4                                2X Avg.Risk  9.6    7.1                                3X Avg.Risk 23.4   11.0   VITAMIN D  25 Hydroxy (Vit-D Deficiency, Fractures)     Status: Abnormal   Collection Time: 02/20/23  2:21 PM  Result Value Ref Range   Vit D, 25-Hydroxy 22.0 (L) 30.0 - 100.0 ng/mL    Comment: Vitamin D  deficiency has been defined by the Institute of Medicine and an Endocrine Society practice guideline as a level of serum 25-OH vitamin D  less than 20 ng/mL (1,2). The Endocrine Society went on to further define vitamin  D insufficiency as a level between 21 and 29  ng/mL (2). 1. IOM (Institute of Medicine). 2010. Dietary reference    intakes for calcium  and D. [place]: The    [employer]. 2. Holick MF, Binkley [place], Bischoff-Ferrari HA, et al.    Evaluation, treatment, and prevention of vitamin D     deficiency: an Endocrine Society clinical practice    guideline. JCEM. 2011 Jul; 96(7):1911-30.   CMP14+EGFR     Status: Abnormal   Collection Time: 02/20/23  2:21 PM  Result Value Ref Range   Glucose 110 (H) 70 - 99 mg/dL   BUN 7 6 - 20 mg/dL   Creatinine, Ser 9.38 0.57 - 1.00 mg/dL   eGFR 882 >40 fO/fpw/8.26   BUN/Creatinine Ratio 11 9 - 23   Sodium 138 134 - 144 mmol/L   Potassium 4.7 3.5 - 5.2 mmol/L   Chloride 101 96 - 106 mmol/L   CO2 17 (L) 20 - 29 mmol/L   Calcium  9.6 8.7 - 10.2 mg/dL   Total Protein 7.1 6.0 - 8.5 g/dL   Albumin 4.2 3.9 - 4.9 g/dL   Globulin, Total 2.9 1.5 - 4.5 g/dL   Bilirubin Total 0.2 0.0 - 1.2 mg/dL   Alkaline Phosphatase 55 44 - 121 IU/L   AST 17 0 - 40 IU/L   ALT 15 0 - 32 IU/L  TSH     Status: None   Collection Time: 02/20/23  2:21 PM  Result Value Ref Range   TSH 2.800 0.450 - 4.500 uIU/mL  Hemoglobin A1c     Status: Abnormal   Collection Time: 02/20/23  2:21 PM  Result Value Ref Range   Hgb A1c MFr Bld 6.2 (H) 4.8 - 5.6 %    Comment:          Prediabetes: 5.7 - 6.4          Diabetes: >6.4          Glycemic control for adults with diabetes: <7.0    Est. average glucose Bld gHb Est-mCnc 131 mg/dL  Vitamin A87     Status: None   Collection Time: 02/20/23  2:21 PM  Result Value Ref Range   Vitamin B-12 252 232 - 1,245 pg/mL  CBC with Diff     Status: None   Collection Time: 02/20/23  2:21 PM  Result Value Ref Range   WBC 10.6 3.4 - 10.8 x10E3/uL   RBC 4.39 3.77 - 5.28 x10E6/uL   Hemoglobin 11.9 11.1 - 15.9 g/dL   Hematocrit 62.4 65.9 - 46.6 %   MCV 85 79 - 97 fL   MCH 27.1 26.6 - 33.0 pg   MCHC 31.7 31.5 - 35.7 g/dL   RDW 88.0 88.2 -  84.5 %   Platelets 397 150 - 450 x10E3/uL   Neutrophils 60 Not Estab. %   Lymphs 29 Not Estab. %   Monocytes 8 Not Estab. %   Eos 2 Not Estab. %   Basos 1 Not Estab. %   Neutrophils Absolute 6.4 1.4 - 7.0 x10E3/uL   Lymphocytes Absolute 3.0 0.7 - 3.1 x10E3/uL   Monocytes Absolute 0.8 0.1 - 0.9 x10E3/uL   EOS (ABSOLUTE) 0.3 0.0 - 0.4 x10E3/uL   Basophils Absolute 0.1 0.0 - 0.2 x10E3/uL   Immature Granulocytes 0 Not Estab. %   Immature Grans (Abs) 0.0 0.0 - 0.1 x10E3/uL  HIV antibody (with reflex)     Status: None   Collection Time: 02/20/23  2:21 PM  Result Value Ref Range   HIV Screen 4th Generation wRfx  Non Reactive Non Reactive    Comment: HIV-1/HIV-2 antibodies and HIV-1 p24 antigen were NOT detected. There is no laboratory evidence of HIV infection. HIV Negative   Hepatitis C Ab reflex to Quant PCR     Status: None   Collection Time: 02/20/23  2:21 PM  Result Value Ref Range   HCV Ab Non Reactive Non Reactive  Interpretation:     Status: None   Collection Time: 02/20/23  2:21 PM  Result Value Ref Range   HCV Interp 1: Comment     Comment: Not infected with HCV unless early or acute infection is suspected (which may be delayed in an immunocompromised individual), or other evidence exists to indicate HCV infection.        Assessment & Plan:   Problem List Items Addressed This Visit       Cardiovascular and Mediastinum   Essential hypertension, benign   Relevant Medications   losartan  (COZAAR ) 25 MG tablet   Other Relevant Orders   CMP14+EGFR (Completed)   CBC with Diff (Completed)     Endocrine   Type 2 diabetes mellitus with hyperglycemia (HCC) - Primary (Chronic)   Checking labs today. Will call pt. With results  Continue current diabetes POC, as patient has been well controlled on current regimen.  Will adjust meds if needed based on labs.        Relevant Medications   Semaglutide , 1 MG/DOSE, (OZEMPIC , 1 MG/DOSE,) 4 MG/3ML SOPN   losartan  (COZAAR ) 25  MG tablet   Other Relevant Orders   CMP14+EGFR (Completed)   Hemoglobin A1c (Completed)   CBC with Diff (Completed)   Polyneuropathy due to type 2 diabetes mellitus (HCC)   Relevant Medications   escitalopram  (LEXAPRO ) 20 MG tablet   Semaglutide , 1 MG/DOSE, (OZEMPIC , 1 MG/DOSE,) 4 MG/3ML SOPN   losartan  (COZAAR ) 25 MG tablet   busPIRone  (BUSPAR ) 5 MG tablet   Other Relevant Orders   CMP14+EGFR (Completed)   Hemoglobin A1c (Completed)   CBC with Diff (Completed)     Other   Hyperlipidemia (Chronic)   Checking labs today.  Continue current therapy for lipid control. Will modify as needed based on labwork results.        Relevant Medications   losartan  (COZAAR ) 25 MG tablet   Other Relevant Orders   Lipid panel (Completed)   CMP14+EGFR (Completed)   CBC with Diff (Completed)   Anxiety   Relevant Medications   escitalopram  (LEXAPRO ) 20 MG tablet   busPIRone  (BUSPAR ) 5 MG tablet   Other Relevant Orders   CMP14+EGFR (Completed)   CBC with Diff (Completed)   Vitamin D  deficiency, unspecified   Checking labs today.  Will continue supplements as needed.        Relevant Orders   VITAMIN D  25 Hydroxy (Vit-D Deficiency, Fractures) (Completed)   CMP14+EGFR (Completed)   CBC with Diff (Completed)   B12 deficiency due to diet   Relevant Orders   CMP14+EGFR (Completed)   Vitamin B12 (Completed)   CBC with Diff (Completed)   Other Visit Diagnoses       Other fatigue       Relevant Orders   CMP14+EGFR (Completed)   TSH (Completed)   CBC with Diff (Completed)     Need for hepatitis C screening test       Test ordered in office today. Will call with results.   Relevant Orders   CMP14+EGFR (Completed)   CBC with Diff (Completed)   Hepatitis C Ab reflex to Quant PCR (  Completed)     Screening for HIV without presence of risk factors       Test ordered in office today. Will call with results.   Relevant Orders   CMP14+EGFR (Completed)   CBC with Diff (Completed)   HIV  antibody (with reflex) (Completed)       Return in about 2 weeks (around 03/06/2023) for F/U.   Total time spent: 30 minutes  ALAN CHRISTELLA ARRANT, FNP  02/20/2023   This document may have been prepared by [facility] Voice Recognition software and as such may include unintentional dictation errors.

## 2023-02-24 NOTE — Assessment & Plan Note (Signed)
 Checking labs today.  Will continue supplements as needed.

## 2023-02-24 NOTE — Assessment & Plan Note (Signed)
 Checking labs today.  Continue current therapy for lipid control. Will modify as needed based on labwork results.

## 2023-02-24 NOTE — Assessment & Plan Note (Signed)
 Checking labs today. Will call pt. With results  Continue current diabetes POC, as patient has been well controlled on current regimen.  Will adjust meds if needed based on labs.

## 2023-03-06 ENCOUNTER — Ambulatory Visit: Payer: Medicaid Other | Admitting: Family

## 2023-03-07 ENCOUNTER — Other Ambulatory Visit: Payer: Self-pay | Admitting: Family

## 2023-03-07 DIAGNOSIS — Z3041 Encounter for surveillance of contraceptive pills: Secondary | ICD-10-CM

## 2023-03-13 ENCOUNTER — Ambulatory Visit: Payer: Medicaid Other | Admitting: Family

## 2023-05-03 ENCOUNTER — Other Ambulatory Visit: Payer: Self-pay | Admitting: Family

## 2023-05-13 ENCOUNTER — Other Ambulatory Visit: Payer: Self-pay | Admitting: Family

## 2023-05-13 ENCOUNTER — Encounter: Payer: Self-pay | Admitting: Family

## 2023-05-14 ENCOUNTER — Other Ambulatory Visit: Payer: Self-pay

## 2023-05-15 ENCOUNTER — Ambulatory Visit
Admission: RE | Admit: 2023-05-15 | Discharge: 2023-05-15 | Disposition: A | Discharge status: Home / Self Care | Source: Ambulatory Visit | Attending: Family | Admitting: Family

## 2023-05-15 ENCOUNTER — Ambulatory Visit
Admission: RE | Admit: 2023-05-15 | Discharge: 2023-05-15 | Disposition: A | Discharge status: Home / Self Care | Attending: Family | Admitting: Family

## 2023-05-15 ENCOUNTER — Encounter: Payer: Self-pay | Admitting: Family

## 2023-05-15 ENCOUNTER — Ambulatory Visit: Admitting: Family

## 2023-05-15 VITALS — BP 136/105 | HR 127 | Ht 69.0 in | Wt 203.0 lb

## 2023-05-15 DIAGNOSIS — M5416 Radiculopathy, lumbar region: Secondary | ICD-10-CM | POA: Insufficient documentation

## 2023-05-16 LAB — IRON,TIBC AND FERRITIN PANEL
Ferritin: 165 ng/mL — ABNORMAL HIGH (ref 15–150)
Iron Saturation: 51 % (ref 15–55)
Iron: 188 ug/dL — ABNORMAL HIGH (ref 27–159)
Total Iron Binding Capacity: 368 ug/dL (ref 250–450)
UIBC: 180 ug/dL (ref 131–425)

## 2023-05-16 LAB — VITAMIN B12: Vitamin B-12: 281 pg/mL (ref 232–1245)

## 2023-05-16 LAB — TRYPTASE: Tryptase: 2.9 ug/L (ref 2.2–13.2)

## 2023-05-29 ENCOUNTER — Ambulatory Visit (INDEPENDENT_AMBULATORY_CARE_PROVIDER_SITE_OTHER): Admitting: Family

## 2023-05-29 ENCOUNTER — Encounter: Payer: Self-pay | Admitting: Family

## 2023-05-29 VITALS — BP 142/88 | HR 100 | Ht 69.0 in | Wt 207.6 lb

## 2023-05-29 DIAGNOSIS — I10 Essential (primary) hypertension: Secondary | ICD-10-CM

## 2023-05-29 DIAGNOSIS — E538 Deficiency of other specified B group vitamins: Secondary | ICD-10-CM | POA: Diagnosis not present

## 2023-05-29 DIAGNOSIS — E1142 Type 2 diabetes mellitus with diabetic polyneuropathy: Secondary | ICD-10-CM

## 2023-05-29 MED ORDER — CYANOCOBALAMIN 1000 MCG/ML IJ SOLN
1000.0000 ug | Freq: Once | INTRAMUSCULAR | Status: AC
  Administered 2023-05-29: 1000 ug via INTRAMUSCULAR

## 2023-05-29 MED ORDER — CYANOCOBALAMIN 1000 MCG/ML IJ SOLN: 1000.0000 ug | INTRAMUSCULAR | 1 refills | Status: AC

## 2023-06-12 ENCOUNTER — Ambulatory Visit

## 2023-06-13 ENCOUNTER — Other Ambulatory Visit: Payer: Self-pay | Admitting: Family

## 2023-06-20 ENCOUNTER — Ambulatory Visit

## 2023-06-21 ENCOUNTER — Other Ambulatory Visit: Payer: Self-pay

## 2023-06-21 ENCOUNTER — Encounter: Payer: Self-pay | Admitting: Family

## 2023-06-21 MED ORDER — GABAPENTIN 400 MG PO CAPS: 400.0000 mg | ORAL_CAPSULE | Freq: Three times a day (TID) | ORAL | 1 refills | Status: DC

## 2023-07-22 ENCOUNTER — Encounter: Payer: Self-pay | Admitting: Family

## 2023-07-23 NOTE — Telephone Encounter (Signed)
 Per Alan verbal, she has pts paper and will get it filled out

## 2023-08-06 ENCOUNTER — Ambulatory Visit: Admitting: Family

## 2023-08-06 ENCOUNTER — Encounter: Payer: Self-pay | Admitting: Family

## 2023-08-06 VITALS — BP 122/84 | HR 105 | Ht 69.0 in | Wt 208.0 lb

## 2023-08-06 DIAGNOSIS — E1142 Type 2 diabetes mellitus with diabetic polyneuropathy: Secondary | ICD-10-CM | POA: Diagnosis not present

## 2023-08-06 DIAGNOSIS — E538 Deficiency of other specified B group vitamins: Secondary | ICD-10-CM

## 2023-08-06 MED ORDER — PREGABALIN 100 MG PO CAPS: 100.0000 mg | ORAL_CAPSULE | Freq: Two times a day (BID) | ORAL | 1 refills | Status: DC

## 2023-08-06 MED ORDER — CYANOCOBALAMIN 1000 MCG/ML IJ SOLN
1000.0000 ug | Freq: Once | INTRAMUSCULAR | Status: AC
  Administered 2023-08-06: 1000 ug via INTRAMUSCULAR

## 2023-08-06 NOTE — Progress Notes (Signed)
 Established Patient Office Visit  Subjective:  Patient ID: Carla Payne, female    DOB: [birth year]  Age: 38 y.o. MRN: [id number]  Chief Complaint  Patient presents with   Follow-up    10 week follow up    Patient is here today for her 10 week follow up.  She has been feeling about the same since last appointment.    She does not have additional concerns to discuss today.  Labs are not due today.  She needs refills.   I have reviewed her active problem list, medication list, allergies, notes from last encounter, lab results for her appointment today.      No other concerns at this time.   Past Medical History:  Diagnosis Date   Anxiety 03/21/2021   Cervical dysplasia 2014   CINI   Diabetes mellitus without complication (HCC) 2014   Dysrhythmia 08/21/2019   rapid pulse   Essential (hemorrhagic) thrombocythemia (HCC) 05/19/2022   Essential hypertension, benign 05/19/2022   Fatty liver 01/11/2016   Hidradenitis 2014   Hypercholesteremia 06/30/2012   Hyperlipidemia 06/30/2012   Pilonidal cyst 2012    Past Surgical History:  Procedure Laterality Date   AXILLARY HIDRADENITIS EXCISION  01-05-13   axillary mass Left 2014 ,may   BREAST CYST ASPIRATION Left 10/08/2017   2 abcesses removed   BREAST CYST ASPIRATION Right 10/2017   abcess removed   COLPOSCOPY  2014   HYDRADENITIS EXCISION Bilateral 10/12/2014   Procedure: EXCISION HIDRADENITIS GROIN;  Surgeon: Reyes LELON Cota, MD;  Location: [facility];  Service: General;  Laterality: Bilateral;   HYDRADENITIS EXCISION Left 08/24/2016   Procedure: EXCISION HIDRADENITIS AXILLA;  Surgeon: Cota Reyes LELON, MD;  Location: [facility];  Service: General;  Laterality: Left;   HYDRADENITIS EXCISION Left 09/03/2018   Procedure: EXCISION HIDRADENITIS GROIN, LEFT, DIABETIC;  Surgeon: Marolyn Nest, MD;  Location: [facility];  Service: General;  Laterality: Left;   PERINEAL HIDRADENITIS EXCISION     WISDOM TOOTH EXTRACTION       Social History   Socioeconomic History   Marital status: Single    Spouse name: Not on file   Number of children: Not on file   Years of education: Not on file   Highest education level: Not on file  Occupational History   Not on file  Tobacco Use   Smoking status: Some Days    Current packs/day: 0.50    Average packs/day: 0.5 packs/day for 13.0 years (6.5 ttl pk-yrs)    Types: Cigarettes   Smokeless tobacco: Never  Vaping Use   Vaping status: Never Used  Substance and Sexual Activity   Alcohol use: Yes    Comment: wine on weekends   Drug use: No   Sexual activity: Yes    Birth control/protection: Pill  Other Topics Concern   Not on file  Social History Narrative   Not on file   Social Drivers of Health   Financial Resource Strain: Not on file  Food Insecurity: Not on file  Transportation Needs: Not on file  Physical Activity: Not on file  Stress: Not on file  Social Connections: Not on file  Intimate Partner Violence: Not on file    Family History  Problem Relation Age of Onset   Hypertension Father     Allergies  Allergen Reactions   Bupropion  Other (See Comments)    Vivid Dreams, Seizures, headaches    Review of Systems  Musculoskeletal:        Leg pain  Neurological:  Positive for sensory change.  All other systems reviewed and are negative.      Objective:   BP 122/84   Pulse (!) 105   Ht 5' 9 (1.753 m)   Wt 208 lb (94.3 kg)   SpO2 98%   BMI 30.72 kg/m   Vitals:   08/06/23 1358  BP: 122/84  Pulse: (!) 105  Height: 5' 9 (1.753 m)  Weight: 208 lb (94.3 kg)  SpO2: 98%  BMI (Calculated): 30.7    Physical Exam Vitals and nursing note reviewed.  Constitutional:      Appearance: Normal appearance. She is normal weight.  HENT:     Head: Normocephalic.  Eyes:     Extraocular Movements: Extraocular movements intact.     Conjunctiva/sclera: Conjunctivae normal.     Pupils: Pupils are equal, round, and reactive to light.   Cardiovascular:     Rate and Rhythm: Normal rate.  Pulmonary:     Effort: Pulmonary effort is normal.  Musculoskeletal:     Right hip: Decreased strength.     Left hip: Decreased strength.     Right lower leg: Tenderness present.     Left lower leg: Tenderness present.     Right ankle: Tenderness present. Decreased range of motion.     Left ankle: Tenderness present. Decreased range of motion.     Right foot: Decreased range of motion. Tenderness present.     Left foot: Decreased range of motion. Tenderness present.  Neurological:     General: No focal deficit present.     Mental Status: She is alert and oriented to person, place, and time. Mental status is at baseline.     Motor: Weakness present.     Gait: Gait abnormal. Tandem walk normal.  Psychiatric:        Mood and Affect: Mood normal.        Behavior: Behavior normal.        Thought Content: Thought content normal.        Judgment: Judgment normal.      No results found for any visits on 08/06/23.  No results found for this or any previous visit (from the past 2160 hours).      Assessment & Plan Polyneuropathy due to type 2 diabetes mellitus (HCC) Continue increased dose of Gabapentin .  Will reassess at follow up.  Sending order for a cane for patient for support.   B12 deficiency due to diet B12 given in office today.  Sending RX for her to bring to us  for her future shots.     No follow-ups on file.   Total time spent: 20 minutes  ALAN CHRISTELLA ARRANT, FNP  08/06/2023   This document may have been prepared by [facility] Voice Recognition software and as such may include unintentional dictation errors.

## 2023-08-08 ENCOUNTER — Other Ambulatory Visit: Payer: Self-pay | Admitting: Family

## 2023-08-17 ENCOUNTER — Other Ambulatory Visit: Payer: Self-pay | Admitting: Family

## 2023-08-20 ENCOUNTER — Ambulatory Visit: Admitting: Family

## 2023-08-20 DIAGNOSIS — E538 Deficiency of other specified B group vitamins: Secondary | ICD-10-CM | POA: Diagnosis not present

## 2023-08-20 MED ORDER — CYANOCOBALAMIN 1000 MCG/ML IJ SOLN
1000.0000 ug | Freq: Once | INTRAMUSCULAR | Status: AC
  Administered 2023-08-20: 1000 ug via INTRAMUSCULAR

## 2023-08-22 ENCOUNTER — Encounter: Payer: Self-pay | Admitting: Family

## 2023-08-22 NOTE — Progress Notes (Signed)
   CHIEF COMPLAINT  B12 Shot     REASON FOR VISIT  B12 Injection     ASSESSMENT  B12 Deficiency, Unspecified     PLAN  Diagnoses and all orders for this visit:  B12 deficiency due to diet -     cyanocobalamin  (VITAMIN B12) injection 1,000 mcg     Pt. given B12 injection in clinic.  Return for next injection per provider instructions.   Total time spent: 5 minutes  ALAN CHRISTELLA ARRANT, FNP  08/20/2023

## 2023-08-25 ENCOUNTER — Encounter: Payer: Self-pay | Admitting: Family

## 2023-08-25 NOTE — Progress Notes (Signed)
 Established Patient Office Visit  Subjective:  Patient ID: Carla Payne, female    DOB: 06/12/1985  Age: 38 y.o. MRN: [id number]  Chief Complaint  Patient presents with   Follow-up    2 week follow up    Patient is here today for her 2 week follow up.  She has been feeling better since last appointment.   She does have additional concerns to discuss today.  Still having significant issues with her neuropathy.  Labs are not due today.  She needs refills.   I have reviewed her active problem list, medication list, allergies, notes from last encounter, lab results for her appointment today.      No other concerns at this time.   Past Medical History:  Diagnosis Date   Anxiety 03/21/2021   Cervical dysplasia 2014   CINI   Diabetes mellitus without complication (HCC) 2014   Dysrhythmia 08/21/2019   rapid pulse   Essential (hemorrhagic) thrombocythemia (HCC) 05/19/2022   Essential hypertension, benign 05/19/2022   Fatty liver 01/11/2016   Hidradenitis 2014   Hypercholesteremia 06/30/2012   Hyperlipidemia 06/30/2012   Pilonidal cyst 2012    Past Surgical History:  Procedure Laterality Date   AXILLARY HIDRADENITIS EXCISION  01-05-13   axillary mass Left 2014 ,may   BREAST CYST ASPIRATION Left 10/08/2017   2 abcesses removed   BREAST CYST ASPIRATION Right 10/2017   abcess removed   COLPOSCOPY  2014   HYDRADENITIS EXCISION Bilateral 10/12/2014   Procedure: EXCISION HIDRADENITIS GROIN;  Surgeon: Reyes LELON Cota, MD;  Location: [facility];  Service: General;  Laterality: Bilateral;   HYDRADENITIS EXCISION Left 08/24/2016   Procedure: EXCISION HIDRADENITIS AXILLA;  Surgeon: Cota Reyes LELON, MD;  Location: [facility];  Service: General;  Laterality: Left;   HYDRADENITIS EXCISION Left 09/03/2018   Procedure: EXCISION HIDRADENITIS GROIN, LEFT, DIABETIC;  Surgeon: Marolyn Nest, MD;  Location: [facility];  Service: General;  Laterality: Left;   PERINEAL HIDRADENITIS  EXCISION     WISDOM TOOTH EXTRACTION      Social History   Socioeconomic History   Marital status: Single    Spouse name: Not on file   Number of children: Not on file   Years of education: Not on file   Highest education level: Not on file  Occupational History   Not on file  Tobacco Use   Smoking status: Some Days    Current packs/day: 0.50    Average packs/day: 0.5 packs/day for 13.0 years (6.5 ttl pk-yrs)    Types: Cigarettes   Smokeless tobacco: Never  Vaping Use   Vaping status: Never Used  Substance and Sexual Activity   Alcohol use: Yes    Comment: wine on weekends   Drug use: No   Sexual activity: Yes    Birth control/protection: Pill  Other Topics Concern   Not on file  Social History Narrative   Not on file   Social Drivers of Health   Financial Resource Strain: Not on file  Food Insecurity: Not on file  Transportation Needs: Not on file  Physical Activity: Not on file  Stress: Not on file  Social Connections: Not on file  Intimate Partner Violence: Not on file    Family History  Problem Relation Age of Onset   Hypertension Father     Allergies  Allergen Reactions   Bupropion  Other (See Comments)    Vivid Dreams, Seizures, headaches    Review of Systems  Neurological:  Positive for tingling and sensory  change.  All other systems reviewed and are negative.      Objective:   BP (!) 142/88   Pulse 100   Ht 5' 9 (1.753 m)   Wt 207 lb 9.6 oz (94.2 kg)   LMP 05/15/2023   SpO2 97%   BMI 30.66 kg/m   Vitals:   05/29/23 1507  BP: (!) 142/88  Pulse: 100  Height: 5' 9 (1.753 m)  Weight: 207 lb 9.6 oz (94.2 kg)  SpO2: 97%  BMI (Calculated): 30.64    Physical Exam Vitals and nursing note reviewed.  Constitutional:      Appearance: Normal appearance. She is normal weight.  HENT:     Head: Normocephalic.  Eyes:     Extraocular Movements: Extraocular movements intact.     Conjunctiva/sclera: Conjunctivae normal.     Pupils:  Pupils are equal, round, and reactive to light.  Cardiovascular:     Rate and Rhythm: Normal rate.  Pulmonary:     Effort: Pulmonary effort is normal.  Neurological:     General: No focal deficit present.     Mental Status: She is alert and oriented to person, place, and time. Mental status is at baseline.     Sensory: Sensory deficit (Lower extremities.) present.  Psychiatric:        Mood and Affect: Mood normal.        Behavior: Behavior normal. Behavior is cooperative.        Thought Content: Thought content normal.        Judgment: Judgment normal.      No results found for any visits on 05/29/23.  No results found for this or any previous visit (from the past 2160 hours).     Assessment & Plan B12 deficiency due to diet B12 given in office today.  Sending RX for her to bring to us  for her future shots.  Polyneuropathy due to type 2 diabetes mellitus (HCC) Continue increased dose of Gabapentin .  Will reassess at follow up.      Return in about 2 months (around 07/29/2023).   Total time spent: 20 minutes  ALAN CHRISTELLA ARRANT, FNP  05/29/2023   This document may have been prepared by [facility] Voice Recognition software and as such may include unintentional dictation errors.

## 2023-08-25 NOTE — Assessment & Plan Note (Signed)
 B12 given in office today.  Sending RX for her to bring to us  for her future shots.

## 2023-08-25 NOTE — Assessment & Plan Note (Signed)
 Continue increased dose of Gabapentin .  Will reassess at follow up.

## 2023-09-03 ENCOUNTER — Encounter: Payer: Self-pay | Admitting: Internal Medicine

## 2023-09-03 ENCOUNTER — Ambulatory Visit (INDEPENDENT_AMBULATORY_CARE_PROVIDER_SITE_OTHER): Admitting: Internal Medicine

## 2023-09-03 DIAGNOSIS — E538 Deficiency of other specified B group vitamins: Secondary | ICD-10-CM

## 2023-09-03 MED ORDER — CYANOCOBALAMIN 1000 MCG/ML IJ SOLN
1000.0000 ug | Freq: Once | INTRAMUSCULAR | Status: AC
  Administered 2023-09-03: 1000 ug via INTRAMUSCULAR

## 2023-09-03 NOTE — Progress Notes (Signed)
 Vit B 12 inj.

## 2023-09-10 NOTE — Telephone Encounter (Signed)
 Paperwork was faxed and has been scanned into pt chart

## 2023-09-16 ENCOUNTER — Encounter: Payer: Self-pay | Admitting: Family

## 2023-09-16 NOTE — Assessment & Plan Note (Signed)
 Continue increased dose of Gabapentin .  Will reassess at follow up.  Sending order for a cane for patient for support.

## 2023-09-16 NOTE — Assessment & Plan Note (Signed)
 B12 given in office today.  Sending RX for her to bring to us  for her future shots.

## 2023-09-17 ENCOUNTER — Encounter: Payer: Self-pay | Admitting: Family

## 2023-09-17 ENCOUNTER — Ambulatory Visit (INDEPENDENT_AMBULATORY_CARE_PROVIDER_SITE_OTHER): Admitting: Family

## 2023-09-17 DIAGNOSIS — E538 Deficiency of other specified B group vitamins: Secondary | ICD-10-CM

## 2023-09-17 MED ORDER — CYANOCOBALAMIN 1000 MCG/ML IJ SOLN
1000.0000 ug | Freq: Once | INTRAMUSCULAR | Status: AC
  Administered 2023-09-17: 1000 ug via INTRAMUSCULAR

## 2023-09-17 NOTE — Progress Notes (Signed)
   CHIEF COMPLAINT  B12 Shot     REASON FOR VISIT  B12 Injection     ASSESSMENT  B12 Deficiency, Unspecified     PLAN  Diagnoses and all orders for this visit:  B12 deficiency due to diet -     cyanocobalamin  (VITAMIN B12) injection 1,000 mcg     Pt. given B12 injection in clinic.  Return for next injection per provider instructions.   Total time spent: 5 minutes  ALAN CHRISTELLA ARRANT, FNP  09/17/2023

## 2023-10-01 ENCOUNTER — Ambulatory Visit (INDEPENDENT_AMBULATORY_CARE_PROVIDER_SITE_OTHER): Admitting: Family

## 2023-10-01 DIAGNOSIS — E538 Deficiency of other specified B group vitamins: Secondary | ICD-10-CM | POA: Diagnosis not present

## 2023-10-01 MED ORDER — CYANOCOBALAMIN 1000 MCG/ML IJ SOLN
1000.0000 ug | Freq: Once | INTRAMUSCULAR | Status: AC
  Administered 2023-10-01: 1000 ug via INTRAMUSCULAR

## 2023-10-01 NOTE — Progress Notes (Signed)
   CHIEF COMPLAINT  B12 Shot     REASON FOR VISIT  B12 Injection     ASSESSMENT  B12 Deficiency, Unspecified     PLAN  Diagnoses and all orders for this visit:  B12 deficiency due to diet -     cyanocobalamin  (VITAMIN B12) injection 1,000 mcg     Pt. given B12 injection in clinic.  Return for next injection per provider instructions.   Total time spent: 5 minutes  ALAN CHRISTELLA ARRANT, FNP  10/01/2023

## 2023-10-07 ENCOUNTER — Ambulatory Visit: Admitting: Family

## 2023-10-11 ENCOUNTER — Other Ambulatory Visit: Payer: Self-pay | Admitting: Family

## 2023-11-11 ENCOUNTER — Encounter: Payer: Self-pay | Admitting: Family

## 2023-11-11 ENCOUNTER — Ambulatory Visit: Admitting: Family

## 2023-11-11 VITALS — BP 122/92 | HR 90 | Ht 69.0 in | Wt 209.6 lb

## 2023-11-11 DIAGNOSIS — E1165 Type 2 diabetes mellitus with hyperglycemia: Secondary | ICD-10-CM

## 2023-11-11 DIAGNOSIS — Z114 Encounter for screening for human immunodeficiency virus [HIV]: Secondary | ICD-10-CM

## 2023-11-11 DIAGNOSIS — E782 Mixed hyperlipidemia: Secondary | ICD-10-CM

## 2023-11-11 DIAGNOSIS — E538 Deficiency of other specified B group vitamins: Secondary | ICD-10-CM

## 2023-11-11 DIAGNOSIS — E1142 Type 2 diabetes mellitus with diabetic polyneuropathy: Secondary | ICD-10-CM

## 2023-11-11 DIAGNOSIS — I10 Essential (primary) hypertension: Secondary | ICD-10-CM | POA: Diagnosis not present

## 2023-11-11 DIAGNOSIS — F419 Anxiety disorder, unspecified: Secondary | ICD-10-CM

## 2023-11-11 DIAGNOSIS — E559 Vitamin D deficiency, unspecified: Secondary | ICD-10-CM

## 2023-11-11 DIAGNOSIS — Z1159 Encounter for screening for other viral diseases: Secondary | ICD-10-CM

## 2023-11-11 DIAGNOSIS — R5383 Other fatigue: Secondary | ICD-10-CM

## 2023-11-11 LAB — POCT CBG (FASTING - GLUCOSE)-MANUAL ENTRY: Glucose Fasting, POC: 227 mg/dL — AB (ref 70–99)

## 2023-11-11 MED ORDER — CYANOCOBALAMIN 1000 MCG/ML IJ SOLN
1000.0000 ug | Freq: Once | INTRAMUSCULAR | Status: AC
  Administered 2023-11-11: 1000 ug via INTRAMUSCULAR

## 2023-11-11 NOTE — Progress Notes (Signed)
 Established Patient Office Visit  Subjective:  Patient ID: Carla Payne, female    DOB: 04-17-1985  Age: 38 y.o. MRN: [id number]  Chief Complaint  Patient presents with   Follow-up    Patient is here today for her 4 months follow up.  She has been feeling fairly well since last appointment.   She does have additional concerns to discuss today.   Reports current right axillary cyst, painful to palpation.  Has history of hidradenitis suppurativa (HS), with multiple excisions on bilateral bikini areas, resulting in significant scar tissue. Experienced approximately 30 staples on both sides after one surgery.  - Reports generalized fatigue - Reports history of anxiety, uncontrolled type 2 diabetes, severe diabetic neuropathy, and cardiac dysrhythmia  She needs a shower chair as a result of her neuropathy, as she has not been able to manage her   Labs are due today.  She needs refills.   I have reviewed her active problem list, medication list, allergies, health maintenance, notes from last encounter, lab results for her appointment today.      No other concerns at this time.   Past Medical History:  Diagnosis Date   Anxiety 03/21/2021   Cervical dysplasia 2014   CINI   Diabetes mellitus without complication (HCC) 2014   Dysrhythmia 08/21/2019   rapid pulse   Essential (hemorrhagic) thrombocythemia (HCC) 05/19/2022   Essential hypertension, benign 05/19/2022   Fatty liver 01/11/2016   Hidradenitis 2014   Hypercholesteremia 06/30/2012   Hyperlipidemia 06/30/2012   Pilonidal cyst 2012    Past Surgical History:  Procedure Laterality Date   AXILLARY HIDRADENITIS EXCISION  01-05-13   axillary mass Left 2014 ,may   BREAST CYST ASPIRATION Left 10/08/2017   2 abcesses removed   BREAST CYST ASPIRATION Right 10/2017   abcess removed   COLPOSCOPY  2014   HYDRADENITIS EXCISION Bilateral 10/12/2014   Procedure: EXCISION HIDRADENITIS GROIN;  Surgeon: Reyes LELON Cota, MD;   Location: [facility];  Service: General;  Laterality: Bilateral;   HYDRADENITIS EXCISION Left 08/24/2016   Procedure: EXCISION HIDRADENITIS AXILLA;  Surgeon: Cota Reyes LELON, MD;  Location: [facility];  Service: General;  Laterality: Left;   HYDRADENITIS EXCISION Left 09/03/2018   Procedure: EXCISION HIDRADENITIS GROIN, LEFT, DIABETIC;  Surgeon: Marolyn Nest, MD;  Location: [facility];  Service: General;  Laterality: Left;   PERINEAL HIDRADENITIS EXCISION     WISDOM TOOTH EXTRACTION      Social History   Socioeconomic History   Marital status: Single    Spouse name: Not on file   Number of children: Not on file   Years of education: Not on file   Highest education level: Not on file  Occupational History   Not on file  Tobacco Use   Smoking status: Some Days    Current packs/day: 0.50    Average packs/day: 0.5 packs/day for 13.0 years (6.5 ttl pk-yrs)    Types: Cigarettes   Smokeless tobacco: Never  Vaping Use   Vaping status: Never Used  Substance and Sexual Activity   Alcohol use: Yes    Comment: wine on weekends   Drug use: No   Sexual activity: Yes    Birth control/protection: Pill  Other Topics Concern   Not on file  Social History Narrative   Not on file   Social Drivers of Health   Financial Resource Strain: Not on file  Food Insecurity: Not on file  Transportation Needs: Not on file  Physical Activity: Not on file  Stress: Not on file  Social Connections: Not on file  Intimate Partner Violence: Not on file    Family History  Problem Relation Age of Onset   Hypertension Father     Allergies  Allergen Reactions   Bupropion  Other (See Comments)    Vivid Dreams, Seizures, headaches    Review of Systems  All other systems reviewed and are negative.      Objective:   BP (!) 122/92   Pulse 90   Ht 5' 9 (1.753 m)   Wt 209 lb 9.6 oz (95.1 kg)   SpO2 99%   BMI 30.95 kg/m   Vitals:   11/11/23 1458  BP: (!) 122/92  Pulse: 90  Height: 5' 9  (1.753 m)  Weight: 209 lb 9.6 oz (95.1 kg)  SpO2: 99%  BMI (Calculated): 30.94    Physical Exam Vitals and nursing note reviewed.  Constitutional:      Appearance: Normal appearance. She is normal weight.  HENT:     Head: Normocephalic.  Eyes:     Extraocular Movements: Extraocular movements intact.     Conjunctiva/sclera: Conjunctivae normal.     Pupils: Pupils are equal, round, and reactive to light.  Cardiovascular:     Rate and Rhythm: Normal rate.  Pulmonary:     Effort: Pulmonary effort is normal.  Neurological:     General: No focal deficit present.     Mental Status: She is alert and oriented to person, place, and time. Mental status is at baseline.  Psychiatric:        Mood and Affect: Mood normal.        Behavior: Behavior normal.        Thought Content: Thought content normal.      Results for orders placed or performed in visit on 11/11/23  POCT CBG (Fasting - Glucose)  Result Value Ref Range   Glucose Fasting, POC 227 (A) 70 - 99 mg/dL    Recent Results (from the past 2160 hours)  POCT CBG (Fasting - Glucose)     Status: Abnormal   Collection Time: 11/11/23  3:04 PM  Result Value Ref Range   Glucose Fasting, POC 227 (A) 70 - 99 mg/dL       Assessment & Plan Type 2 diabetes mellitus with hyperglycemia, without long-term current use of insulin  (HCC) Polyneuropathy due to type 2 diabetes mellitus (HCC) Checking labs today. Will call pt. With results  Continue current diabetes POC, as patient has been well controlled on current regimen.  Will adjust meds if needed based on labs.   -CBC w/Diff -CMP w/eGFR -Hemoglobin A1C  Essential hypertension, benign Blood pressure well controlled with current medications.  Continue current therapy.  Will reassess at follow up.   - CBC w/Diff - CMP w/eGFR  Vitamin D  deficiency, unspecified B12 deficiency due to diet Other fatigue Checking labs today.  Will continue supplements as needed.   - Vitamin  D - Vitamin B12 - TSH  Anxiety Patient stable.  Well controlled with current therapy.   Continue current meds.   Mixed hyperlipidemia Checking labs today.  Continue current therapy for lipid control. Will modify as needed based on labwork results.   -CMP w/eGFR -Lipid Panel  [employer] letter provided, outlining conditions including severe diabetic neuropathy (with numbness in feet and legs, mobility difficulty, requiring a cane for ambulation and a shower chair for bathing), uncontrolled type 2 diabetes, hidradenitis suppurativa (with recurrent flares, inflammation, abscess formation, drainage, pain, discomfort,  and extensive scar tissue from excisions), and cardiac dysrhythmia (causing dizziness, fatigue, reduced physical activity tolerance).  Return in about 3 months (around 02/11/2024).   Total time spent: 30 minutes  ALAN CHRISTELLA ARRANT, FNP  11/11/2023   This document may have been prepared by [facility] Voice Recognition software and as such may include unintentional dictation errors.

## 2023-11-21 ENCOUNTER — Encounter: Payer: Self-pay | Admitting: Family

## 2023-11-25 ENCOUNTER — Encounter: Payer: Self-pay | Admitting: Family

## 2023-11-25 NOTE — Assessment & Plan Note (Signed)
 Checking labs today. Will call pt. With results  Continue current diabetes POC, as patient has been well controlled on current regimen.  Will adjust meds if needed based on labs.   -CBC w/Diff -CMP w/eGFR -Hemoglobin A1C

## 2023-11-25 NOTE — Assessment & Plan Note (Signed)
 Checking labs today.  Continue current therapy for lipid control. Will modify as needed based on labwork results.   -CMP w/eGFR -Lipid Panel

## 2023-11-25 NOTE — Assessment & Plan Note (Signed)
 Checking labs today.  Will continue supplements as needed.   - Vitamin D  - Vitamin B12 - TSH

## 2023-11-25 NOTE — Assessment & Plan Note (Signed)
 Blood pressure well controlled with current medications.  Continue current therapy.  Will reassess at follow up.   - CBC w/Diff - CMP w/eGFR

## 2023-11-25 NOTE — Assessment & Plan Note (Signed)
 Patient stable.  Well controlled with current therapy.   Continue current meds.

## 2024-01-07 ENCOUNTER — Other Ambulatory Visit: Payer: Self-pay

## 2024-01-07 ENCOUNTER — Emergency Department

## 2024-01-07 ENCOUNTER — Emergency Department
Admission: EM | Admit: 2024-01-07 | Discharge: 2024-01-07 | Disposition: A | Discharge status: Home / Self Care | Attending: Emergency Medicine | Admitting: Emergency Medicine

## 2024-01-07 DIAGNOSIS — M25512 Pain in left shoulder: Secondary | ICD-10-CM | POA: Insufficient documentation

## 2024-01-07 DIAGNOSIS — E119 Type 2 diabetes mellitus without complications: Secondary | ICD-10-CM | POA: Insufficient documentation

## 2024-01-07 DIAGNOSIS — M25572 Pain in left ankle and joints of left foot: Secondary | ICD-10-CM | POA: Insufficient documentation

## 2024-01-07 DIAGNOSIS — Y92410 Unspecified street and highway as the place of occurrence of the external cause: Secondary | ICD-10-CM | POA: Insufficient documentation

## 2024-01-07 DIAGNOSIS — I10 Essential (primary) hypertension: Secondary | ICD-10-CM | POA: Insufficient documentation

## 2024-01-07 MED ORDER — CYCLOBENZAPRINE HCL 5 MG PO TABS: 5.0000 mg | ORAL_TABLET | Freq: Three times a day (TID) | ORAL | 0 refills | Status: AC | PRN

## 2024-01-07 MED ORDER — LIDOCAINE 5 % EX PTCH
1.0000 | MEDICATED_PATCH | CUTANEOUS | Status: DC
  Administered 2024-01-07: 1 via TRANSDERMAL
  Filled 2024-01-07: qty 1

## 2024-01-07 MED ORDER — ACETAMINOPHEN 325 MG PO TABS
650.0000 mg | ORAL_TABLET | Freq: Once | ORAL | Status: AC
  Administered 2024-01-07: 650 mg via ORAL
  Filled 2024-01-07: qty 2

## 2024-01-07 MED ORDER — KETOROLAC TROMETHAMINE 30 MG/ML IJ SOLN
30.0000 mg | Freq: Once | INTRAMUSCULAR | Status: AC
  Administered 2024-01-07: 30 mg via INTRAMUSCULAR
  Filled 2024-01-07: qty 1

## 2024-01-07 MED ORDER — CYCLOBENZAPRINE HCL 10 MG PO TABS
5.0000 mg | ORAL_TABLET | Freq: Once | ORAL | Status: AC
  Administered 2024-01-07: 5 mg via ORAL
  Filled 2024-01-07: qty 1

## 2024-01-07 MED ORDER — ACETAMINOPHEN 500 MG PO TABS
1000.0000 mg | ORAL_TABLET | Freq: Once | ORAL | Status: DC
  Filled 2024-01-07: qty 2
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
## 2024-01-07 NOTE — Discharge Instructions (Addendum)
Your x-rays were negative 
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
## 2024-01-21 ENCOUNTER — Other Ambulatory Visit: Payer: Self-pay | Admitting: Family

## 2024-01-22 ENCOUNTER — Other Ambulatory Visit: Payer: Self-pay

## 2024-01-22 ENCOUNTER — Encounter: Payer: Self-pay | Admitting: *Deleted

## 2024-01-22 ENCOUNTER — Emergency Department
Admission: EM | Admit: 2024-01-22 | Discharge: 2024-01-23 | Disposition: A | Discharge status: Home / Self Care | Attending: Emergency Medicine | Admitting: Emergency Medicine

## 2024-01-22 DIAGNOSIS — I10 Essential (primary) hypertension: Secondary | ICD-10-CM | POA: Diagnosis not present

## 2024-01-22 DIAGNOSIS — E1165 Type 2 diabetes mellitus with hyperglycemia: Secondary | ICD-10-CM | POA: Diagnosis not present

## 2024-01-22 DIAGNOSIS — M5432 Sciatica, left side: Secondary | ICD-10-CM

## 2024-01-22 DIAGNOSIS — M545 Low back pain, unspecified: Secondary | ICD-10-CM | POA: Diagnosis present

## 2024-01-22 DIAGNOSIS — M5442 Lumbago with sciatica, left side: Secondary | ICD-10-CM | POA: Insufficient documentation

## 2024-01-22 LAB — URINALYSIS, W/ REFLEX TO CULTURE (INFECTION SUSPECTED)
Bacteria, UA: NONE SEEN
Bilirubin Urine: NEGATIVE
Glucose, UA: >=500 mg/dL — AB
Hgb urine dipstick: NEGATIVE
Ketones, ur: 20 mg/dL — AB
Leukocytes,Ua: NEGATIVE
Nitrite: NEGATIVE
Protein, ur: NEGATIVE mg/dL
RBC / HPF: 0 RBC/hpf (ref 0–5)
Specific Gravity, Urine: 1.033 — ABNORMAL HIGH (ref 1.005–1.030)
pH: 5 (ref 5.0–8.0)

## 2024-01-22 LAB — BASIC METABOLIC PANEL WITH GFR
Anion gap: 15 (ref 5–15)
BUN: 7 mg/dL (ref 6–20)
CO2: 20 mmol/L — ABNORMAL LOW (ref 22–32)
Calcium: 9.4 mg/dL (ref 8.9–10.3)
Chloride: 97 mmol/L — ABNORMAL LOW (ref 98–111)
Creatinine, Ser: 0.59 mg/dL (ref 0.44–1.00)
GFR, Estimated: >60 mL/min
Glucose, Bld: 421 mg/dL — ABNORMAL HIGH (ref 70–99)
Potassium: 4.6 mmol/L (ref 3.5–5.1)
Sodium: 131 mmol/L — ABNORMAL LOW (ref 135–145)

## 2024-01-22 LAB — CBC
HCT: 43.4 % (ref 36.0–46.0)
Hemoglobin: 14.1 g/dL (ref 12.0–15.0)
MCH: 26.8 pg (ref 26.0–34.0)
MCHC: 32.5 g/dL (ref 30.0–36.0)
MCV: 82.5 fL (ref 80.0–100.0)
Platelets: 321 K/uL (ref 150–400)
RBC: 5.26 MIL/uL — ABNORMAL HIGH (ref 3.87–5.11)
RDW: 12.7 % (ref 11.5–15.5)
WBC: 9.5 K/uL (ref 4.0–10.5)
nRBC: 0 % (ref 0.0–0.2)

## 2024-01-22 LAB — CBG MONITORING, ED
Glucose-Capillary: 375 mg/dL — ABNORMAL HIGH (ref 70–99)
Glucose-Capillary: 293 mg/dL — ABNORMAL HIGH (ref 70–99)

## 2024-01-22 MED ORDER — KETOROLAC TROMETHAMINE 15 MG/ML IJ SOLN
15.0000 mg | Freq: Once | INTRAMUSCULAR | Status: AC
  Administered 2024-01-22: 15 mg via INTRAVENOUS
  Filled 2024-01-22: qty 1

## 2024-01-22 MED ORDER — PREGABALIN 50 MG PO CAPS
100.0000 mg | ORAL_CAPSULE | ORAL | Status: AC
  Administered 2024-01-22: 100 mg via ORAL
  Filled 2024-01-22: qty 2

## 2024-01-22 MED ORDER — SODIUM CHLORIDE 0.9 % IV BOLUS
1000.0000 mL | Freq: Once | INTRAVENOUS | Status: AC
  Administered 2024-01-22: 1000 mL via INTRAVENOUS

## 2024-01-22 MED ORDER — MORPHINE SULFATE (PF) 4 MG/ML IV SOLN
4.0000 mg | Freq: Once | INTRAVENOUS | Status: AC
  Administered 2024-01-22: 4 mg via INTRAVENOUS
  Filled 2024-01-22: qty 1

## 2024-01-22 MED ORDER — OXYCODONE-ACETAMINOPHEN 5-325 MG PO TABS
2.0000 | ORAL_TABLET | Freq: Once | ORAL | Status: AC
  Administered 2024-01-23: 2 via ORAL
  Filled 2024-01-22: qty 2

## 2024-01-22 MED ORDER — LIDOCAINE 5 % EX PTCH: 1.0000 | MEDICATED_PATCH | CUTANEOUS | 0 refills | Status: AC

## 2024-01-22 MED ORDER — NAPROXEN 500 MG PO TABS: 500.0000 mg | ORAL_TABLET | Freq: Two times a day (BID) | ORAL | 0 refills | Status: AC

## 2024-01-22 MED ORDER — INSULIN ASPART 100 UNIT/ML IJ SOLN
6.0000 [IU] | Freq: Once | INTRAMUSCULAR | Status: AC
  Administered 2024-01-22: 6 [IU] via INTRAVENOUS
  Filled 2024-01-22: qty 6

## 2024-01-22 MED ORDER — LIDOCAINE 5 % EX PTCH
1.0000 | MEDICATED_PATCH | CUTANEOUS | Status: DC
  Administered 2024-01-22: 1 via TRANSDERMAL
  Filled 2024-01-22: qty 1

## 2024-01-22 MED ORDER — ACETAMINOPHEN 500 MG PO TABS
1000.0000 mg | ORAL_TABLET | Freq: Once | ORAL | Status: AC
  Administered 2024-01-22: 1000 mg via ORAL
  Filled 2024-01-22: qty 2

## 2024-01-22 MED ORDER — OXYCODONE-ACETAMINOPHEN 5-325 MG PO TABS: 2.0000 | ORAL_TABLET | Freq: Four times a day (QID) | ORAL | 0 refills | Status: AC | PRN
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
## 2024-01-22 NOTE — ED Notes (Signed)
 Fsbs 375 in triage.

## 2024-01-22 NOTE — ED Notes (Signed)
 Went to go medicate and give patient discharge paperwork and patient had left.
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
## 2024-01-24 ENCOUNTER — Other Ambulatory Visit: Payer: Self-pay | Admitting: Family

## 2024-01-24 DIAGNOSIS — Z3041 Encounter for surveillance of contraceptive pills: Secondary | ICD-10-CM

## 2024-01-29 ENCOUNTER — Ambulatory Visit: Admitting: Family

## 2024-01-29 ENCOUNTER — Encounter: Payer: Self-pay | Admitting: Family

## 2024-01-29 VITALS — BP 122/90 | HR 111 | Ht 69.0 in | Wt 200.4 lb

## 2024-01-29 DIAGNOSIS — E1165 Type 2 diabetes mellitus with hyperglycemia: Secondary | ICD-10-CM

## 2024-01-29 DIAGNOSIS — J069 Acute upper respiratory infection, unspecified: Secondary | ICD-10-CM

## 2024-01-29 DIAGNOSIS — M5432 Sciatica, left side: Secondary | ICD-10-CM

## 2024-01-29 LAB — POCT XPERT XPRESS SARS COVID-2/FLU/RSV
FLU A: NEGATIVE
FLU B: NEGATIVE
RSV RNA, PCR: NEGATIVE
SARS Coronavirus 2: NEGATIVE

## 2024-01-29 LAB — POCT CBG (FASTING - GLUCOSE)-MANUAL ENTRY: Glucose Fasting, POC: 177 mg/dL — AB (ref 70–99)

## 2024-01-29 MED ORDER — OXYCODONE-ACETAMINOPHEN 5-325 MG PO TABS: 1.0000 | ORAL_TABLET | Freq: Four times a day (QID) | ORAL | 0 refills | Status: AC | PRN

## 2024-01-29 MED ORDER — AMOXICILLIN-POT CLAVULANATE 875-125 MG PO TABS: 1.0000 | ORAL_TABLET | Freq: Two times a day (BID) | ORAL | 0 refills | Status: AC
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
## 2024-01-31 ENCOUNTER — Ambulatory Visit
Admission: RE | Admit: 2024-01-31 | Discharge: 2024-01-31 | Disposition: A | Discharge status: Home / Self Care | Attending: Family | Admitting: Family

## 2024-01-31 ENCOUNTER — Ambulatory Visit
Admission: RE | Admit: 2024-01-31 | Discharge: 2024-01-31 | Disposition: A | Discharge status: Home / Self Care | Source: Ambulatory Visit | Attending: Family | Admitting: Family

## 2024-01-31 ENCOUNTER — Other Ambulatory Visit: Payer: Self-pay | Admitting: Family

## 2024-01-31 DIAGNOSIS — M5432 Sciatica, left side: Secondary | ICD-10-CM

## 2024-02-07 ENCOUNTER — Ambulatory Visit: Payer: Self-pay

## 2024-02-11 ENCOUNTER — Ambulatory Visit: Admitting: Family

## 2024-02-21 NOTE — Telephone Encounter (Signed)
 Letter was typed and put in her chart on 11/11/23

## 2024-02-26 ENCOUNTER — Ambulatory Visit: Admitting: Family
